# Patient Record
Sex: Female | Born: 1945 | Race: Black or African American | Hispanic: No | Smoking: Current every day smoker
Health system: Southern US, Community
[De-identification: ages and names within clinical notes are randomized; demographics above are authoritative.]

## PROBLEM LIST (undated history)

## (undated) DIAGNOSIS — I10 Essential (primary) hypertension: Secondary | ICD-10-CM

## (undated) DIAGNOSIS — G2581 Restless legs syndrome: Secondary | ICD-10-CM

## (undated) DIAGNOSIS — E785 Hyperlipidemia, unspecified: Secondary | ICD-10-CM

## (undated) DIAGNOSIS — I82402 Acute embolism and thrombosis of unspecified deep veins of left lower extremity: Secondary | ICD-10-CM

## (undated) HISTORY — PX: COLONOSCOPY: SHX174

## (undated) HISTORY — PX: CHOLECYSTECTOMY: SHX55

## (undated) HISTORY — PX: ABDOMINAL HYSTERECTOMY: SHX81

---

## 2003-12-27 ENCOUNTER — Ambulatory Visit: Payer: Self-pay

## 2004-01-10 ENCOUNTER — Ambulatory Visit: Payer: Self-pay

## 2004-11-19 ENCOUNTER — Ambulatory Visit: Payer: Self-pay | Admitting: Internal Medicine

## 2005-12-23 ENCOUNTER — Ambulatory Visit: Payer: Self-pay | Admitting: Family Medicine

## 2006-01-20 ENCOUNTER — Ambulatory Visit: Payer: Self-pay | Admitting: Gastroenterology

## 2006-12-20 ENCOUNTER — Ambulatory Visit: Payer: Self-pay | Admitting: Family Medicine

## 2007-12-26 ENCOUNTER — Ambulatory Visit: Payer: Self-pay | Admitting: Family Medicine

## 2008-06-07 ENCOUNTER — Emergency Department: Payer: Self-pay | Admitting: Emergency Medicine

## 2008-12-26 ENCOUNTER — Ambulatory Visit: Payer: Self-pay | Admitting: Family Medicine

## 2009-12-30 ENCOUNTER — Ambulatory Visit: Payer: Self-pay | Admitting: Family Medicine

## 2010-08-14 ENCOUNTER — Ambulatory Visit: Payer: Self-pay | Admitting: Internal Medicine

## 2010-09-27 ENCOUNTER — Emergency Department: Payer: Self-pay | Admitting: *Deleted

## 2011-01-05 ENCOUNTER — Ambulatory Visit: Payer: Self-pay

## 2011-04-02 ENCOUNTER — Ambulatory Visit: Payer: Self-pay

## 2011-04-02 LAB — CBC WITH DIFFERENTIAL/PLATELET
Basophil #: 0.2 10*3/uL — ABNORMAL HIGH (ref 0.0–0.1)
Eosinophil #: 0.3 10*3/uL (ref 0.0–0.7)
Eosinophil %: 4.9 %
HCT: 42 % (ref 35.0–47.0)
HGB: 14 g/dL (ref 12.0–16.0)
Lymphocyte %: 32.2 %
MCHC: 33.3 g/dL (ref 32.0–36.0)
Neutrophil #: 3.2 10*3/uL (ref 1.4–6.5)
Neutrophil %: 53.2 %
Platelet: 270 10*3/uL (ref 150–440)
RBC: 4.79 10*6/uL (ref 3.80–5.20)
RDW: 13.6 % (ref 11.5–14.5)

## 2011-04-02 LAB — COMPREHENSIVE METABOLIC PANEL
Albumin: 4 g/dL (ref 3.4–5.0)
Alkaline Phosphatase: 101 U/L (ref 50–136)
Bilirubin,Total: 0.4 mg/dL (ref 0.2–1.0)
Calcium, Total: 9.3 mg/dL (ref 8.5–10.1)
Chloride: 103 mmol/L (ref 98–107)
Co2: 32 mmol/L (ref 21–32)
Creatinine: 0.83 mg/dL (ref 0.60–1.30)
EGFR (African American): >60
EGFR (Non-African Amer.): >60
Osmolality: 281 (ref 275–301)
SGOT(AST): 8 U/L — ABNORMAL LOW (ref 15–37)
SGPT (ALT): 21 U/L
Sodium: 142 mmol/L (ref 136–145)

## 2011-04-02 LAB — URINALYSIS, COMPLETE
Bacteria: NEGATIVE
Bilirubin,UR: NEGATIVE
Glucose,UR: NEGATIVE mg/dL (ref 0–75)
Nitrite: NEGATIVE
Specific Gravity: 1.01 (ref 1.003–1.030)

## 2011-04-02 LAB — LIPASE, BLOOD: Lipase: 77 U/L (ref 73–393)

## 2011-04-03 LAB — URINE CULTURE

## 2012-01-13 ENCOUNTER — Ambulatory Visit: Payer: Self-pay

## 2012-02-24 ENCOUNTER — Ambulatory Visit: Payer: Self-pay

## 2013-01-16 ENCOUNTER — Ambulatory Visit: Payer: Self-pay | Admitting: *Deleted

## 2013-06-09 ENCOUNTER — Ambulatory Visit: Payer: Self-pay | Admitting: Family Medicine

## 2013-06-09 LAB — URINALYSIS, COMPLETE
Bilirubin,UR: NEGATIVE
Glucose,UR: NEGATIVE mg/dL (ref 0–75)
KETONE: NEGATIVE
Leukocyte Esterase: NEGATIVE
Nitrite: NEGATIVE
Ph: 6 (ref 4.5–8.0)
SPECIFIC GRAVITY: 1.025 (ref 1.003–1.030)
WBC UR: NONE SEEN /HPF (ref 0–5)

## 2013-06-09 LAB — GC/CHLAMYDIA PROBE AMP

## 2013-06-09 LAB — WET PREP, GENITAL

## 2013-07-03 ENCOUNTER — Ambulatory Visit: Payer: Self-pay | Admitting: Family Medicine

## 2013-12-06 DIAGNOSIS — H409 Unspecified glaucoma: Secondary | ICD-10-CM | POA: Insufficient documentation

## 2014-01-17 ENCOUNTER — Ambulatory Visit: Payer: Self-pay | Admitting: Family Medicine

## 2014-12-18 ENCOUNTER — Encounter: Payer: Self-pay | Admitting: *Deleted

## 2014-12-19 ENCOUNTER — Ambulatory Visit: Payer: Medicare HMO | Admitting: Anesthesiology

## 2014-12-19 ENCOUNTER — Ambulatory Visit
Admission: RE | Admit: 2014-12-19 | Discharge: 2014-12-19 | Disposition: A | Discharge status: Home Health | Payer: Medicare HMO | Source: Ambulatory Visit | Attending: Unknown Physician Specialty | Admitting: Unknown Physician Specialty

## 2014-12-19 ENCOUNTER — Encounter
Admission: RE | Disposition: A | Discharge status: Home Health | Payer: Self-pay | Source: Ambulatory Visit | Attending: Unknown Physician Specialty

## 2014-12-19 ENCOUNTER — Encounter: Payer: Self-pay | Admitting: Anesthesiology

## 2014-12-19 DIAGNOSIS — K5730 Diverticulosis of large intestine without perforation or abscess without bleeding: Secondary | ICD-10-CM | POA: Insufficient documentation

## 2014-12-19 DIAGNOSIS — K621 Rectal polyp: Secondary | ICD-10-CM | POA: Insufficient documentation

## 2014-12-19 DIAGNOSIS — Z9049 Acquired absence of other specified parts of digestive tract: Secondary | ICD-10-CM | POA: Diagnosis not present

## 2014-12-19 DIAGNOSIS — E785 Hyperlipidemia, unspecified: Secondary | ICD-10-CM | POA: Diagnosis not present

## 2014-12-19 DIAGNOSIS — Z79899 Other long term (current) drug therapy: Secondary | ICD-10-CM | POA: Diagnosis not present

## 2014-12-19 DIAGNOSIS — Z90710 Acquired absence of both cervix and uterus: Secondary | ICD-10-CM | POA: Diagnosis not present

## 2014-12-19 DIAGNOSIS — D124 Benign neoplasm of descending colon: Secondary | ICD-10-CM | POA: Diagnosis not present

## 2014-12-19 DIAGNOSIS — D125 Benign neoplasm of sigmoid colon: Secondary | ICD-10-CM | POA: Insufficient documentation

## 2014-12-19 DIAGNOSIS — G2581 Restless legs syndrome: Secondary | ICD-10-CM | POA: Insufficient documentation

## 2014-12-19 DIAGNOSIS — Z86010 Personal history of colonic polyps: Secondary | ICD-10-CM | POA: Insufficient documentation

## 2014-12-19 DIAGNOSIS — K640 First degree hemorrhoids: Secondary | ICD-10-CM | POA: Insufficient documentation

## 2014-12-19 HISTORY — DX: Restless legs syndrome: G25.81

## 2014-12-19 HISTORY — DX: Hyperlipidemia, unspecified: E78.5

## 2014-12-19 HISTORY — PX: COLONOSCOPY WITH PROPOFOL: SHX5780

## 2014-12-19 SURGERY — COLONOSCOPY WITH PROPOFOL
Anesthesia: General

## 2014-12-19 MED ORDER — EPHEDRINE SULFATE 50 MG/ML IJ SOLN
INTRAMUSCULAR | Status: DC | PRN
  Administered 2014-12-19 (×2): 5 mg via INTRAVENOUS

## 2014-12-19 MED ORDER — FENTANYL CITRATE (PF) 100 MCG/2ML IJ SOLN
INTRAMUSCULAR | Status: DC | PRN
  Administered 2014-12-19: 50 ug via INTRAVENOUS

## 2014-12-19 MED ORDER — PROPOFOL 500 MG/50ML IV EMUL
INTRAVENOUS | Status: DC | PRN
  Administered 2014-12-19: 120 ug/kg/min via INTRAVENOUS

## 2014-12-19 MED ORDER — MIDAZOLAM HCL 2 MG/2ML IJ SOLN
INTRAMUSCULAR | Status: DC | PRN
  Administered 2014-12-19: 1 mg via INTRAVENOUS

## 2014-12-19 MED ORDER — SODIUM CHLORIDE 0.9 % IV SOLN: INTRAVENOUS | Status: DC

## 2014-12-19 MED ORDER — SODIUM CHLORIDE 0.9 % IV SOLN
INTRAVENOUS | Status: DC
  Administered 2014-12-19: 1000 mL via INTRAVENOUS

## 2014-12-19 NOTE — Anesthesia Postprocedure Evaluation (Signed)
  Anesthesia Post-op Note  Patient: Rachel Best  Procedure(s) Performed: Procedure(s): COLONOSCOPY WITH PROPOFOL (N/A)  Anesthesia type:General  Patient location: PACU  Post pain: Pain level controlled  Post assessment: Post-op Vital signs reviewed, Patient's Cardiovascular Status Stable, Respiratory Function Stable, Patent Airway and No signs of Nausea or vomiting  Post vital signs: Reviewed and stable  Last Vitals:  Filed Vitals:   12/19/14 1209  BP: 103/57  Pulse: 81  Temp:   Resp: 17    Level of consciousness: awake, alert  and patient cooperative  Complications: No apparent anesthesia complications

## 2014-12-19 NOTE — H&P (Signed)
   Primary Care Physician:  Pcp Not In System Primary Gastroenterologist:  Dr. Vira Agar  Pre-Procedure History & Physical: HPI:  Rachel Best is a 69 y.o. female is here for an colonoscopy.   Past Medical History  Diagnosis Date  . Hyperlipidemia   . Restless legs syndrome (RLS)     Past Surgical History  Procedure Laterality Date  . Cholecystectomy    . Abdominal hysterectomy      Prior to Admission medications   Medication Sig Start Date End Date Taking? Authorizing Provider  Flaxseed, Linseed, (FLAXSEED OIL PO) Take by mouth.   Yes Historical Provider, MD  naproxen (NAPROSYN) 500 MG tablet Take 500 mg by mouth 2 (two) times daily with a meal.   Yes Historical Provider, MD  OMEGA-3 FATTY ACIDS PO Take by mouth.   Yes Historical Provider, MD  pseudoephedrine (SUDAFED) 30 MG tablet Take 30 mg by mouth every 4 (four) hours as needed for congestion.   Yes Historical Provider, MD  simvastatin (ZOCOR) 20 MG tablet Take 20 mg by mouth daily.   Yes Historical Provider, MD    Allergies as of 11/18/2014  . (Not on File)    History reviewed. No pertinent family history.  Social History   Social History  . Marital Status: Married    Spouse Name: N/A  . Number of Children: N/A  . Years of Education: N/A   Occupational History  . Not on file.   Social History Main Topics  . Smoking status: Not on file  . Smokeless tobacco: Not on file  . Alcohol Use: Not on file  . Drug Use: Not on file  . Sexual Activity: Not on file   Other Topics Concern  . Not on file   Social History Narrative    Review of Systems: See HPI, otherwise negative ROS  Physical Exam: BP 124/76 mmHg  Pulse 76  Temp(Src) 97.8 F (36.6 C)  Resp 16  Ht 5\' 8"  (1.727 m)  Wt 82.555 kg (182 lb)  BMI 27.68 kg/m2  SpO2 100% General:   Alert,  pleasant and cooperative in NAD Head:  Normocephalic and atraumatic. Neck:  Supple; no masses or thyromegaly. Lungs:  Clear throughout to auscultation.     Heart:  Regular rate and rhythm. Abdomen:  Soft, nontender and nondistended. Normal bowel sounds, without guarding, and without rebound.   Neurologic:  Alert and  oriented x4;  grossly normal neurologically.  Impression/Plan: Rachel Best is here for an colonoscopy to be performed for [facility] colon polyps  Risks, benefits, limitations, and alternatives regarding  colonoscopy have been reviewed with the patient.  Questions have been answered.  All parties agreeable.   Gaylyn Cheers, MD  12/19/2014, 11:19 AM

## 2014-12-19 NOTE — Anesthesia Procedure Notes (Signed)
Performed by: COOK-MARTIN, Natoria Archibald Pre-anesthesia Checklist: Patient identified, Emergency Drugs available, Suction available, Patient being monitored and Timeout performed Patient Re-evaluated:Patient Re-evaluated prior to inductionOxygen Delivery Method: Nasal cannula Preoxygenation: Pre-oxygenation with 100% oxygen Intubation Type: IV induction Placement Confirmation: positive ETCO2 and CO2 detector       

## 2014-12-19 NOTE — Transfer of Care (Signed)
Immediate Anesthesia Transfer of Care Note  Patient: Rachel Best  Procedure(s) Performed: Procedure(s): COLONOSCOPY WITH PROPOFOL (N/A)  Patient Location: PACU  Anesthesia Type:General  Level of Consciousness: awake and sedated  Airway & Oxygen Therapy: Patient Spontanous Breathing and Patient connected to nasal cannula oxygen  Post-op Assessment: Report given to RN and Post -op Vital signs reviewed and stable  Post vital signs: Reviewed and stable  Last Vitals:  Filed Vitals:   12/19/14 1025  BP: 124/76  Pulse: 76  Temp: 36.6 C  Resp: 16    Complications: No apparent anesthesia complications

## 2014-12-19 NOTE — Op Note (Signed)
[facility] Gastroenterology Patient Name: Rachel Best Procedure Date: 12/19/2014 11:12 AM MRN: [id number] Account #: [id number] Date of Birth: 1945-11-02 Admit Type: Outpatient Age: 69 Room: [facility] ENDO ROOM 1 Gender: Female Note Status: Finalized Procedure:         Colonoscopy Indications:       High risk colon cancer surveillance: Personal history of                     colonic polyps Providers:         Manya Silvas, MD Medicines:         Propofol per Anesthesia Complications:     No immediate complications. Procedure:         Pre-Anesthesia Assessment:                    - After reviewing the risks and benefits, the patient was                     deemed in satisfactory condition to undergo the procedure.                    After obtaining informed consent, the colonoscope was                     passed under direct vision. Throughout the procedure, the                     patient's blood pressure, pulse, and oxygen saturations                     were monitored continuously. The Colonoscope was                     introduced through the anus and advanced to the the cecum,                     identified by appendiceal orifice and ileocecal valve. The                     colonoscopy was performed without difficulty. The patient                     tolerated the procedure well. The quality of the bowel                     preparation was excellent. Findings:      Many sessile polyps were found in the rectum and in the descending       colon. The polyps were small in size. These polyps were removed with a       cold snare. Resection and retrieval were complete.      Two sessile polyps were found in the sigmoid colon. The polyps were       diminutive in size. These polyps were removed with a cold biopsy       forceps. Resection and retrieval were complete.      Internal hemorrhoids were found during endoscopy. The hemorrhoids were       small and Grade I  (internal hemorrhoids that do not prolapse).      A few small-mouthed diverticula were found in the sigmoid colon.      The exam was otherwise without abnormality. Impression:        - Many small polyps in the rectum and in  the descending                     colon. Resected and retrieved.                    - Two diminutive polyps in the sigmoid colon. Resected and                     retrieved.                    - Internal hemorrhoids.                    - Diverticulosis in the sigmoid colon.                    - The examination was otherwise normal. Recommendation:    - Await pathology results. Manya Silvas, MD 12/19/2014 11:58:02 AM This report has been signed electronically. Number of Addenda: 0 Note Initiated On: 12/19/2014 11:12 AM Scope Withdrawal Time: 0 hours 22 minutes 14 seconds  Total Procedure Duration: 0 hours 28 minutes 35 seconds       [facility]

## 2014-12-19 NOTE — Anesthesia Preprocedure Evaluation (Signed)
Anesthesia Evaluation  Patient identified by MRN, date of birth, ID band Patient awake    Reviewed: Allergy & Precautions, NPO status , Patient's Chart, lab work & pertinent test results  Airway Mallampati: II       Dental  (+) Upper Dentures, Lower Dentures   Pulmonary  Sinus allergies   Pulmonary exam normal breath sounds clear to auscultation       Cardiovascular negative cardio ROS Normal cardiovascular exam     Neuro/Psych negative neurological ROS  negative psych ROS   GI/Hepatic negative GI ROS, Neg liver ROS,   Endo/Other  negative endocrine ROS  Renal/GU negative Renal ROS     Musculoskeletal negative musculoskeletal ROS (+)   Abdominal Normal abdominal exam  (+)   Peds  Hematology negative hematology ROS (+)   Anesthesia Other Findings Increased cholesterol  Reproductive/Obstetrics                             Anesthesia Physical Anesthesia Plan  ASA: II  Anesthesia Plan: General   Post-op Pain Management:    Induction: Intravenous  Airway Management Planned: Nasal Cannula  Additional Equipment:   Intra-op Plan:   Post-operative Plan:   Informed Consent: I have reviewed the patients History and Physical, chart, labs and discussed the procedure including the risks, benefits and alternatives for the proposed anesthesia with the patient or authorized representative who has indicated his/her understanding and acceptance.   Dental advisory given  Plan Discussed with: CRNA and Surgeon  Anesthesia Plan Comments:         Anesthesia Quick Evaluation

## 2014-12-20 LAB — SURGICAL PATHOLOGY

## 2014-12-23 ENCOUNTER — Encounter: Payer: Self-pay | Admitting: Unknown Physician Specialty

## 2014-12-30 DIAGNOSIS — Z860100 Personal history of colon polyps, unspecified: Secondary | ICD-10-CM | POA: Insufficient documentation

## 2015-04-15 ENCOUNTER — Other Ambulatory Visit: Payer: Self-pay | Admitting: Family Medicine

## 2015-04-15 DIAGNOSIS — Z1231 Encounter for screening mammogram for malignant neoplasm of breast: Secondary | ICD-10-CM

## 2015-04-16 ENCOUNTER — Ambulatory Visit
Admission: RE | Admit: 2015-04-16 | Discharge: 2015-04-16 | Disposition: A | Discharge status: Home / Self Care | Payer: Medicare Other | Source: Ambulatory Visit | Attending: Family Medicine | Admitting: Family Medicine

## 2015-04-16 DIAGNOSIS — Z1231 Encounter for screening mammogram for malignant neoplasm of breast: Secondary | ICD-10-CM | POA: Diagnosis present

## 2015-07-21 ENCOUNTER — Other Ambulatory Visit: Payer: Self-pay | Admitting: Family Medicine

## 2015-07-21 DIAGNOSIS — E041 Nontoxic single thyroid nodule: Secondary | ICD-10-CM

## 2015-07-28 ENCOUNTER — Ambulatory Visit
Admission: RE | Admit: 2015-07-28 | Discharge: 2015-07-28 | Disposition: A | Discharge status: Home / Self Care | Payer: Medicare Other | Source: Ambulatory Visit | Attending: Family Medicine | Admitting: Family Medicine

## 2015-07-28 DIAGNOSIS — E041 Nontoxic single thyroid nodule: Secondary | ICD-10-CM | POA: Diagnosis not present

## 2015-10-16 DIAGNOSIS — H40003 Preglaucoma, unspecified, bilateral: Secondary | ICD-10-CM | POA: Insufficient documentation

## 2015-10-16 DIAGNOSIS — E7800 Pure hypercholesterolemia, unspecified: Secondary | ICD-10-CM | POA: Insufficient documentation

## 2015-10-16 DIAGNOSIS — H269 Unspecified cataract: Secondary | ICD-10-CM | POA: Insufficient documentation

## 2015-10-16 DIAGNOSIS — I10 Essential (primary) hypertension: Secondary | ICD-10-CM | POA: Insufficient documentation

## 2015-12-09 ENCOUNTER — Ambulatory Visit
Admission: EM | Admit: 2015-12-09 | Discharge: 2015-12-09 | Discharge status: Absent without leave | Payer: Medicare Other

## 2015-12-11 ENCOUNTER — Ambulatory Visit: Payer: Medicare Other

## 2015-12-11 ENCOUNTER — Ambulatory Visit
Admission: EM | Admit: 2015-12-11 | Discharge: 2015-12-11 | Disposition: A | Discharge status: Home / Self Care | Payer: Medicare Other | Attending: Family Medicine | Admitting: Family Medicine

## 2015-12-11 ENCOUNTER — Encounter: Payer: Self-pay | Admitting: *Deleted

## 2015-12-11 DIAGNOSIS — F17200 Nicotine dependence, unspecified, uncomplicated: Secondary | ICD-10-CM | POA: Insufficient documentation

## 2015-12-11 DIAGNOSIS — M62838 Other muscle spasm: Secondary | ICD-10-CM

## 2015-12-11 DIAGNOSIS — M479 Spondylosis, unspecified: Secondary | ICD-10-CM | POA: Insufficient documentation

## 2015-12-11 DIAGNOSIS — Z79899 Other long term (current) drug therapy: Secondary | ICD-10-CM | POA: Insufficient documentation

## 2015-12-11 DIAGNOSIS — M47892 Other spondylosis, cervical region: Secondary | ICD-10-CM

## 2015-12-11 MED ORDER — TIZANIDINE HCL 4 MG PO TABS: 4.0000 mg | ORAL_TABLET | Freq: Three times a day (TID) | ORAL | 0 refills | Status: DC | PRN

## 2015-12-11 MED ORDER — MELOXICAM 15 MG PO TABS: 15.0000 mg | ORAL_TABLET | Freq: Every day | ORAL | 0 refills | Status: DC

## 2015-12-11 NOTE — ED Triage Notes (Signed)
Pt awoke with left sided neck pain approx 1 week ago. Denies injury.

## 2015-12-11 NOTE — ED Provider Notes (Signed)
[facility]    CSN: [id number] Arrival date & time: 12/11/15  1604     History   Chief Complaint Chief Complaint  Patient presents with  . Torticollis    HPI Rachel Best is a 70 y.o. female.   Neck Injury  This is a new problem. The current episode started more than 1 week ago. The problem occurs constantly. The problem has not changed since onset.Pertinent negatives include no chest pain, no abdominal pain, no headaches and no shortness of breath. The symptoms are aggravated by bending and twisting. Nothing relieves the symptoms. Treatments tried: NSAIDS. The treatment provided no relief.    Past Medical History:  Diagnosis Date  . Hyperlipidemia   . Restless legs syndrome (RLS)     There are no active problems to display for this patient.   Past Surgical History:  Procedure Laterality Date  . ABDOMINAL HYSTERECTOMY    . CHOLECYSTECTOMY    . COLONOSCOPY WITH PROPOFOL N/A 12/19/2014   Procedure: COLONOSCOPY WITH PROPOFOL;  Surgeon: Manya Silvas, MD;  Location: [facility] ENDOSCOPY;  Service: Endoscopy;  Laterality: N/A;    OB History    No data available       Home Medications    Prior to Admission medications   Medication Sig Start Date End Date Taking? Authorizing Provider  naproxen (NAPROSYN) 500 MG tablet Take 500 mg by mouth 2 (two) times daily with a meal.   Yes Historical Provider, MD  simvastatin (ZOCOR) 20 MG tablet Take 20 mg by mouth daily.   Yes Historical Provider, MD  Flaxseed, Linseed, (FLAXSEED OIL PO) Take by mouth.    Historical Provider, MD  meloxicam (MOBIC) 15 MG tablet Take 1 tablet (15 mg total) by mouth daily. 12/11/15   Frederich Cha, MD  OMEGA-3 FATTY ACIDS PO Take by mouth.    Historical Provider, MD  pseudoephedrine (SUDAFED) 30 MG tablet Take 30 mg by mouth every 4 (four) hours as needed for congestion.    Historical Provider, MD  tiZANidine (ZANAFLEX) 4 MG tablet Take 1 tablet (4 mg total) by mouth every 8 (eight) hours  as needed for muscle spasms. 12/11/15   Frederich Cha, MD    Family History Family History  Problem Relation Age of Onset  . Breast cancer Mother 53    Social History Social History  Substance Use Topics  . Smoking status: Current Every Day Smoker  . Smokeless tobacco: Never Used  . Alcohol use No     Allergies   Penicillins   Review of Systems Review of Systems  Constitutional: Negative for activity change.  HENT: Negative for sore throat.   Respiratory: Negative for shortness of breath.   Cardiovascular: Negative for chest pain.  Gastrointestinal: Negative for abdominal pain.  Musculoskeletal: Positive for neck pain.  Neurological: Negative for headaches.  All other systems reviewed and are negative.    Physical Exam Triage Vital Signs ED Triage Vitals  Enc Vitals Group     BP 12/11/15 1740 133/68     Pulse Rate 12/11/15 1740 81     Resp 12/11/15 1740 16     Temp 12/11/15 1740 98.9 F (37.2 C)     Temp Source 12/11/15 1740 Oral     SpO2 12/11/15 1740 100 %     Weight 12/11/15 1742 182 lb (82.6 kg)     Height 12/11/15 1742 5\' 9"  (1.753 m)     Head Circumference --      [place] Flow --  Pain Score 12/11/15 1746 10     Pain Loc --      Pain Edu? --      Excl. in [place]? --    No data found.   Updated Vital Signs BP 133/68 (BP Location: Left Arm)   Pulse 81   Temp 98.9 F (37.2 C) (Oral)   Resp 16   Ht 5\' 9"  (1.753 m)   Wt 182 lb (82.6 kg)   SpO2 100%   BMI 26.88 kg/m   Visual Acuity Right Eye Distance:   Left Eye Distance:   Bilateral Distance:    Right Eye Near:   Left Eye Near:    Bilateral Near:     Physical Exam  Constitutional: She appears well-developed and well-nourished.  HENT:  Head: Normocephalic.  Eyes: Pupils are equal, round, and reactive to light.  Neck: Neck supple. Muscular tenderness present. Decreased range of motion present. No thyroid mass and no thyromegaly present.    Patient tender along the left trapezius muscle  up or and lower belly.  Vitals reviewed.    UC Treatments / Results  Labs (all labs ordered are listed, but only abnormal results are displayed) Labs Reviewed - No data to display  EKG  EKG Interpretation None       Radiology Dg Cervical Spine Complete  Result Date: 12/11/2015 CLINICAL DATA:  LEFT-sided neck pain for 1 week. EXAM: CERVICAL SPINE - COMPLETE 4+ VIEW COMPARISON:  None. FINDINGS: Cervical vertebral bodies intact and aligned with maintenance of cervical lordosis. Mild C4-5, moderate C5-6 and C6-7 disc height loss with uncovertebral hypertrophy and facet arthropathy. Multilevel moderate to severe neural foraminal narrowing. Multilevel uncovertebral hypertrophy and facet arthropathy. No destructive bony lesions. Lateral masses in alignment. Included prevertebral and paraspinal soft tissue planes are nonsuspicious. Patient is edentulous. IMPRESSION: Degenerative change of the cervical spine resulting in multilevel moderate to severe neural foraminal narrowing. No acute fracture deformity or malalignment. Electronically Signed   By: Elon Alas M.D.   On: 12/11/2015 19:20    Procedures Procedures (including critical care time)  Medications Ordered in UC Medications - No data to display   Initial Impression / Assessment and Plan / UC Course  I have reviewed the triage vital signs and the nursing notes.  Pertinent labs & imaging results that were available during my care of the patient were reviewed by me and considered in my medical decision making (see chart for details).  Clinical Course   Will send patient for x-ray of the C-spine. If C-spine x-rays negative without place her on Zanaflex 4 mg 1 tablet twice a day Mobic 15 mg daily. Recommend ice and medication sections Biofreeze or BenGay. Also recommend if not better by Monday she seek chiropractic health at the Chiropractor of her choice. I gave her Dr.Vin Oswaldo Milian name who is at [place].   Final  Clinical Impressions(s) / UC Diagnoses   Final diagnoses:  Muscle spasms of neck  Other osteoarthritis of spine, cervical region    New Prescriptions New Prescriptions   MELOXICAM (MOBIC) 15 MG TABLET    Take 1 tablet (15 mg total) by mouth daily.   TIZANIDINE (ZANAFLEX) 4 MG TABLET    Take 1 tablet (4 mg total) by mouth every 8 (eight) hours as needed for muscle spasms.     Note: This dictation was prepared with Dragon dictation along with smaller phrase technology. Any transcriptional errors that result from this process are unintentional.   Frederich Cha, MD 12/11/15 [phone]

## 2016-03-12 ENCOUNTER — Encounter (INDEPENDENT_AMBULATORY_CARE_PROVIDER_SITE_OTHER): Payer: Self-pay | Admitting: Vascular Surgery

## 2016-03-12 ENCOUNTER — Encounter (INDEPENDENT_AMBULATORY_CARE_PROVIDER_SITE_OTHER): Payer: Self-pay

## 2016-03-12 ENCOUNTER — Ambulatory Visit (INDEPENDENT_AMBULATORY_CARE_PROVIDER_SITE_OTHER): Payer: Medicare Other | Admitting: Vascular Surgery

## 2016-03-12 VITALS — BP 139/75 | HR 102 | Resp 18 | Ht 68.0 in | Wt 182.0 lb

## 2016-03-12 DIAGNOSIS — I70213 Atherosclerosis of native arteries of extremities with intermittent claudication, bilateral legs: Secondary | ICD-10-CM

## 2016-03-12 DIAGNOSIS — E785 Hyperlipidemia, unspecified: Secondary | ICD-10-CM

## 2016-03-12 DIAGNOSIS — I70219 Atherosclerosis of native arteries of extremities with intermittent claudication, unspecified extremity: Secondary | ICD-10-CM | POA: Insufficient documentation

## 2016-03-12 DIAGNOSIS — F17200 Nicotine dependence, unspecified, uncomplicated: Secondary | ICD-10-CM | POA: Diagnosis not present

## 2016-03-12 NOTE — Assessment & Plan Note (Signed)

## 2016-03-12 NOTE — Progress Notes (Signed)
Patient ID: Rachel Best, female   DOB: [birth year], 71 y.o.   MRN: [id number]  Chief Complaint  Patient presents with  . New Patient (Initial Visit)    HPI Rachel Best is a 71 y.o. female.  I am asked to see the patient by [facility] for evaluation of claudication  symptoms.  The patient reports Cramping in her legs with activity. This happens intermittently and has been gradually worsening over several years now. She does not have symptoms of ischemic rest pain. She does not have ulceration or infection. She reports no clear inciting event or causative factor that started the symptoms. Her left lower extremity is more severely affected than the right. She also describes pain in her hip and thigh that radiated down her leg. The claudication-type symptoms and is cramping in her calf muscle with activity. Given her history of hyperlipidemia and ongoing tobacco use, peripheral arterial disease was considered as a causative factor.   Past Medical History:  Diagnosis Date  . Hyperlipidemia   . Restless legs syndrome (RLS)     Past Surgical History:  Procedure Laterality Date  . ABDOMINAL HYSTERECTOMY    . CHOLECYSTECTOMY    . COLONOSCOPY WITH PROPOFOL N/A 12/19/2014   Procedure: COLONOSCOPY WITH PROPOFOL;  Surgeon: Manya Silvas, MD;  Location: [facility] ENDOSCOPY;  Service: Endoscopy;  Laterality: N/A;    Family History  Problem Relation Age of Onset  . Breast cancer Mother 56  . Diabetes Mother   . Hypertension Mother   . Varicose Veins Maternal Grandmother   NO bleeding or clotting disorders  Social History Social History  Substance Use Topics  . Smoking status: Current Every Day Smoker  . Smokeless tobacco: Never Used  . Alcohol use No  NO IVDU  Allergies  Allergen Reactions  . Penicillins     Current Outpatient Prescriptions  Medication Sig Dispense Refill  . OMEGA-3 FATTY ACIDS PO Take by mouth.    . pseudoephedrine (SUDAFED) 30 MG tablet Take 30 mg by mouth  every 4 (four) hours as needed for congestion.    . simvastatin (ZOCOR) 20 MG tablet Take 20 mg by mouth daily.    . Flaxseed, Linseed, (FLAXSEED OIL PO) Take by mouth.    . meloxicam (MOBIC) 15 MG tablet Take 1 tablet (15 mg total) by mouth daily. (Patient not taking: Reported on 03/12/2016) 30 tablet 0  . naproxen (NAPROSYN) 500 MG tablet Take 500 mg by mouth 2 (two) times daily with a meal.    . tiZANidine (ZANAFLEX) 4 MG tablet Take 1 tablet (4 mg total) by mouth every 8 (eight) hours as needed for muscle spasms. (Patient not taking: Reported on 03/12/2016) 60 tablet 0   No current facility-administered medications for this visit.       REVIEW OF SYSTEMS (Negative unless checked)  Constitutional: [] Weight loss  [] Fever  [] Chills Cardiac: [] Chest pain   [] Chest pressure   [] Palpitations   [] Shortness of breath when laying flat   [] Shortness of breath at rest   [] Shortness of breath with exertion. Vascular:  [x] Pain in legs with walking   [] Pain in legs at rest   [] Pain in legs when laying flat   [x] Claudication   [] Pain in feet when walking  [] Pain in feet at rest  [] Pain in feet when laying flat   [] History of DVT   [] Phlebitis   [] Swelling in legs   [] Varicose veins   [] Non-healing ulcers Pulmonary:   [] Uses home oxygen   []   Productive cough   [] Hemoptysis   [] Wheeze  [] COPD   [] Asthma Neurologic:  [] Dizziness  [] Blackouts   [] Seizures   [] History of stroke   [] History of TIA  [] Aphasia   [] Temporary blindness   [] Dysphagia   [] Weakness or numbness in arms   [] Weakness or numbness in legs Musculoskeletal:  [] Arthritis   [] Joint swelling   [] Joint pain   [] Low back pain Hematologic:  [] Easy bruising  [] Easy bleeding   [] Hypercoagulable state   [] Anemic  [] Hepatitis Gastrointestinal:  [] Blood in stool   [] Vomiting blood  [] Gastroesophageal reflux/heartburn   [] Abdominal pain Genitourinary:  [] Chronic kidney disease   [] Difficult urination  [] Frequent urination  [] Burning with urination    [] Hematuria Skin:  [] Rashes   [] Ulcers   [] Wounds Psychological:  [] History of anxiety   []  History of major depression.    Physical Exam BP 139/75   Pulse (!) 102   Resp 18   Ht 5\' 8"  (1.727 m)   Wt 182 lb (82.6 kg)   BMI 27.67 kg/m  Gen:  WD/WN, NAD Head: [place]/AT, No temporalis wasting. Prominent temp pulse not noted. Ear/Nose/Throat: Hearing grossly intact, nares w/o erythema or drainage, oropharynx w/o Erythema/Exudate Eyes: Conjunctiva clear, sclera non-icteric  Neck: trachea midline.  No JVD.  Pulmonary:  Good air movement, no use of accessory muscles, respirations not labored Cardiac: RRR, normal S1, S2 Vascular:  Vessel Right Left  Radial Palpable Palpable  Ulnar Palpable Palpable  Brachial Palpable Palpable  Carotid Palpable, without bruit Palpable, without bruit  Aorta Not palpable N/A  Femoral Palpable Palpable  Popliteal Palpable 1+ Palpable  PT 1+ Palpable Palpable  DP Palpable Trace Palpable   Gastrointestinal: soft, non-tender/non-distended. No guarding/reflex. No masses, surgical incisions, or scars. Musculoskeletal: M/S 5/5 throughout.  Extremities without ischemic changes.  No deformity or atrophy.  Neurologic: Sensation grossly intact in extremities.  Symmetrical.  Speech is fluent. Motor exam as listed above. Psychiatric: Judgment intact, Mood & affect appropriate for pt's clinical situation. Dermatologic: No rashes or ulcers noted.  No cellulitis or open wounds. Lymph : No Cervical, Axillary, or Inguinal lymphadenopathy.   Radiology No results found.  Labs No results found for this or any previous visit (from the past 2160 hour(s)).  Assessment/Plan:  Hyperlipidemia lipid control important in reducing the progression of atherosclerotic disease. Continue statin therapy   Tobacco use disorder We had a discussion for approximately 3 minutes regarding the absolute need for smoking cessation due to the deleterious nature of tobacco on the vascular  system. We discussed the tobacco use would diminish patency of any intervention, and likely significantly worsen progressio of disease. We discussed multiple agents for quitting including replacement therapy or medications to reduce cravings such as Chantix. The patient voices their understanding of the importance of smoking cessation.   Atherosclerosis of native arteries of extremity with intermittent claudication (HCC)  Recommend:  The patient has atypical pain symptoms for pure atherosclerotic disease. However, on physical exam there is suspicion for peripheral arterial disease. She also has multiple atherosclerotic risk factors including ongoing tobacco use. Cessation of tobacco has been recommended.  Noninvasive studies including ABI's and arterial ultrasound of the legs will be obtained and the patient will follow up with me to review these studies.  The patient should continue walking and begin a more formal exercise program. The patient should continue her statin therapy and aggressive treatment of the lipid abnormalities.  The patient should begin wearing graduated compression socks 15-20 mmHg strength to control edema.  Leotis Pain 03/12/2016, 4:41 PM   This note was created with Dragon medical transcription system.  Any errors from dictation are unintentional.

## 2016-03-12 NOTE — Patient Instructions (Signed)

## 2016-03-12 NOTE — Assessment & Plan Note (Signed)
Recommend:  The patient has atypical pain symptoms for pure atherosclerotic disease. However, on physical exam there is suspicion for peripheral arterial disease. She also has multiple atherosclerotic risk factors including ongoing tobacco use. Cessation of tobacco has been recommended.  Noninvasive studies including ABI's and arterial ultrasound of the legs will be obtained and the patient will follow up with me to review these studies.  The patient should continue walking and begin a more formal exercise program. The patient should continue her statin therapy and aggressive treatment of the lipid abnormalities.  The patient should begin wearing graduated compression socks 15-20 mmHg strength to control edema.

## 2016-03-12 NOTE — Assessment & Plan Note (Signed)
lipid control important in reducing the progression of atherosclerotic disease. Continue statin therapy  

## 2016-04-14 ENCOUNTER — Other Ambulatory Visit: Payer: Self-pay | Admitting: Family Medicine

## 2016-04-14 DIAGNOSIS — Z780 Asymptomatic menopausal state: Secondary | ICD-10-CM

## 2016-04-21 ENCOUNTER — Ambulatory Visit
Admission: RE | Admit: 2016-04-21 | Discharge: 2016-04-21 | Disposition: A | Discharge status: Home / Self Care | Payer: Medicare Other | Source: Ambulatory Visit | Attending: Family Medicine | Admitting: Family Medicine

## 2016-04-21 DIAGNOSIS — Z780 Asymptomatic menopausal state: Secondary | ICD-10-CM | POA: Insufficient documentation

## 2016-05-12 ENCOUNTER — Ambulatory Visit (INDEPENDENT_AMBULATORY_CARE_PROVIDER_SITE_OTHER): Payer: Medicare Other

## 2016-05-12 ENCOUNTER — Encounter (INDEPENDENT_AMBULATORY_CARE_PROVIDER_SITE_OTHER): Payer: Self-pay | Admitting: Vascular Surgery

## 2016-05-12 ENCOUNTER — Ambulatory Visit (INDEPENDENT_AMBULATORY_CARE_PROVIDER_SITE_OTHER): Payer: Medicare Other | Admitting: Vascular Surgery

## 2016-05-12 VITALS — BP 138/81 | HR 107 | Resp 17 | Wt 180.0 lb

## 2016-05-12 DIAGNOSIS — I70212 Atherosclerosis of native arteries of extremities with intermittent claudication, left leg: Secondary | ICD-10-CM | POA: Diagnosis not present

## 2016-05-12 DIAGNOSIS — I70213 Atherosclerosis of native arteries of extremities with intermittent claudication, bilateral legs: Secondary | ICD-10-CM

## 2016-05-12 DIAGNOSIS — F17200 Nicotine dependence, unspecified, uncomplicated: Secondary | ICD-10-CM | POA: Diagnosis not present

## 2016-05-12 DIAGNOSIS — E785 Hyperlipidemia, unspecified: Secondary | ICD-10-CM | POA: Diagnosis not present

## 2016-05-12 NOTE — Progress Notes (Signed)
Subjective:    Patient ID: Rachel Best, female    DOB: 1945/10/31, 71 y.o.   MRN: [id number] Chief Complaint  Patient presents with  . Follow-up   Patient last seen on 03/12/16. She presents today to review vascular studies. Her left lower extremity discomfort is stable. The patient underwent an ABI which showed Right ABI: 0.58 and Left 1.01 (no previous for comparison). A bilateral lower extremity arterial duplex is notable for LEFT: Triphasic / Right: Triphasic transitioning to biphasic (mid-femoral) then transitioning to monophasic (popliteal). The patient denies any right lower extremity claudication like symptoms, rest pain or ulcers to her feet / toes. She continues to have left lower extremity pain with activity. Mostly in the lateral calf. No rest pain or ulceration to the left lower extremity.    Review of Systems  Constitutional: Negative.   HENT: Negative.   Eyes: Negative.   Respiratory: Negative.   Cardiovascular:       Left lower extremity pain  Gastrointestinal: Negative.   Endocrine: Negative.   Genitourinary: Negative.   Musculoskeletal: Negative.   Skin: Negative.   Allergic/Immunologic: Negative.   Neurological: Negative.   Hematological: Negative.   Psychiatric/Behavioral: Negative.       Objective:   Physical Exam  Constitutional: She is oriented to person, place, and time. She appears well-developed and well-nourished. No distress.  HENT:  Head: Normocephalic and atraumatic.  Eyes: Conjunctivae are normal. Pupils are equal, round, and reactive to light.  Neck: Normal range of motion.  Cardiovascular: Normal rate, regular rhythm, normal heart sounds and intact distal pulses.   Pulses:      Radial pulses are 2+ on the right side, and 2+ on the left side.       Dorsalis pedis pulses are 1+ on the right side, and 2+ on the left side.       Posterior tibial pulses are 1+ on the right side, and 2+ on the left side.  Pulmonary/Chest: Effort normal.    Musculoskeletal: Normal range of motion. She exhibits no edema.  Neurological: She is alert and oriented to person, place, and time.  Skin: Skin is warm and dry. She is not diaphoretic.  Psychiatric: She has a normal mood and affect. Her behavior is normal. Judgment and thought content normal.  Vitals reviewed.  BP 138/81   Pulse (!) 107   Resp 17   Wt 180 lb (81.6 kg)   BMI 27.37 kg/m   Past Medical History:  Diagnosis Date  . Hyperlipidemia   . Restless legs syndrome (RLS)    Social History   Social History  . Marital status: Married    Spouse name: N/A  . Number of children: N/A  . Years of education: N/A   Occupational History  . Not on file.   Social History Main Topics  . Smoking status: Former Research scientist (life sciences)  . Smokeless tobacco: Never Used  . Alcohol use No  . Drug use: No  . Sexual activity: Not on file   Other Topics Concern  . Not on file   Social History Narrative  . No narrative on file   Past Surgical History:  Procedure Laterality Date  . ABDOMINAL HYSTERECTOMY    . CHOLECYSTECTOMY    . COLONOSCOPY WITH PROPOFOL N/A 12/19/2014   Procedure: COLONOSCOPY WITH PROPOFOL;  Surgeon: Manya Silvas, MD;  Location: [facility] ENDOSCOPY;  Service: Endoscopy;  Laterality: N/A;   Family History  Problem Relation Age of Onset  . Breast cancer Mother 63  .  Diabetes Mother   . Hypertension Mother   . Varicose Veins Maternal Grandmother    Allergies  Allergen Reactions  . Penicillins   . Aspirin Nausea And Vomiting      Assessment & Plan:  Patient last seen on 03/12/16. She presents today to review vascular studies. Her left lower extremity discomfort is stable. The patient underwent an ABI which showed Right ABI: 0.58 and Left 1.01 (no previous for comparison). A bilateral lower extremity arterial duplex is notable for LEFT: Triphasic / Right: Triphasic transitioning to biphasic (mid-femoral) then transitioning to monophasic (popliteal). The patient denies any right  lower extremity claudication like symptoms, rest pain or ulcers to her feet / toes. She continues to have left lower extremity pain with activity. Mostly in the lateral calf. No rest pain or ulceration to the left lower extremity.   1. Atherosclerosis of native artery of left lower extremity with intermittent claudication ([place]) - Stable Patient with RLE arterial disease however she is asymptomatic at this time. No intervention at this time as she is asymptomatic.  Will follow closely and see back in six months.  Patient to follow up with PCP and undergo work up for DJD and LLE has normal arterial perfusion.  - VAS [place] ABI WITH/WO TBI; Future - VAS [place] LOWER EXTREMITY ARTERIAL DUPLEX; Future  2. Hyperlipidemia, unspecified hyperlipidemia type - Stable Encouraged good control as its slows the progression of atherosclerotic disease  3. Tobacco use disorder - Stable I have discussed (approximately 5 minutes) with the patient the role of tobacco in the pathogenesis of atherosclerosis and its effect on the progression of the disease, impact on the durability of interventions and its limitations on the formation of collateral pathways. I have recommended absolute tobacco cessation. I have discussed various options available for assistance with tobacco cessation including over the counter methods (Nicotine gum, patch and lozenges). We also discussed prescription options (Chantix, Nicotine Inhaler / Nasal Spray). The patient is not interested in pursuing any prescription tobacco cessation options at this time. The patient voices their understanding.   Current Outpatient Prescriptions on File Prior to Visit  Medication Sig Dispense Refill  . OMEGA-3 FATTY ACIDS PO Take by mouth.    . simvastatin (ZOCOR) 20 MG tablet Take 20 mg by mouth daily.    . Flaxseed, Linseed, (FLAXSEED OIL PO) Take by mouth.    . meloxicam (MOBIC) 15 MG tablet Take 1 tablet (15 mg total) by mouth daily. (Patient not taking: Reported  on 03/12/2016) 30 tablet 0  . naproxen (NAPROSYN) 500 MG tablet Take 500 mg by mouth 2 (two) times daily with a meal.    . pseudoephedrine (SUDAFED) 30 MG tablet Take 30 mg by mouth every 4 (four) hours as needed for congestion.    Marland Kitchen tiZANidine (ZANAFLEX) 4 MG tablet Take 1 tablet (4 mg total) by mouth every 8 (eight) hours as needed for muscle spasms. (Patient not taking: Reported on 03/12/2016) 60 tablet 0   No current facility-administered medications on file prior to visit.     There are no Patient Instructions on file for this visit. No Follow-up on file.   Icelynn Onken A Seham Gardenhire, PA-C

## 2016-10-14 ENCOUNTER — Other Ambulatory Visit: Payer: Self-pay | Admitting: Pediatrics

## 2016-10-14 DIAGNOSIS — R059 Cough, unspecified: Secondary | ICD-10-CM

## 2016-10-14 DIAGNOSIS — R05 Cough: Secondary | ICD-10-CM

## 2016-10-14 DIAGNOSIS — R5383 Other fatigue: Secondary | ICD-10-CM

## 2016-10-14 DIAGNOSIS — Z720 Tobacco use: Secondary | ICD-10-CM

## 2016-10-20 ENCOUNTER — Ambulatory Visit
Admission: RE | Admit: 2016-10-20 | Discharge: 2016-10-20 | Disposition: A | Discharge status: Home / Self Care | Payer: Medicare Other | Source: Ambulatory Visit | Attending: Pediatrics | Admitting: Pediatrics

## 2016-10-20 DIAGNOSIS — I2510 Atherosclerotic heart disease of native coronary artery without angina pectoris: Secondary | ICD-10-CM | POA: Insufficient documentation

## 2016-10-20 DIAGNOSIS — I313 Pericardial effusion (noninflammatory): Secondary | ICD-10-CM | POA: Insufficient documentation

## 2016-10-20 DIAGNOSIS — I700 Atherosclerosis of aorta: Secondary | ICD-10-CM | POA: Insufficient documentation

## 2016-10-20 DIAGNOSIS — R059 Cough, unspecified: Secondary | ICD-10-CM

## 2016-10-20 DIAGNOSIS — Z720 Tobacco use: Secondary | ICD-10-CM | POA: Diagnosis not present

## 2016-10-20 DIAGNOSIS — R5383 Other fatigue: Secondary | ICD-10-CM | POA: Insufficient documentation

## 2016-10-20 DIAGNOSIS — R05 Cough: Secondary | ICD-10-CM | POA: Diagnosis present

## 2016-11-12 ENCOUNTER — Ambulatory Visit (INDEPENDENT_AMBULATORY_CARE_PROVIDER_SITE_OTHER): Payer: Medicare Other | Admitting: Vascular Surgery

## 2016-11-12 ENCOUNTER — Ambulatory Visit (INDEPENDENT_AMBULATORY_CARE_PROVIDER_SITE_OTHER): Payer: Medicare Other

## 2016-11-12 ENCOUNTER — Encounter (INDEPENDENT_AMBULATORY_CARE_PROVIDER_SITE_OTHER): Payer: Self-pay | Admitting: Vascular Surgery

## 2016-11-12 VITALS — BP 124/75 | HR 70 | Resp 18 | Ht 68.0 in | Wt 175.0 lb

## 2016-11-12 DIAGNOSIS — I70212 Atherosclerosis of native arteries of extremities with intermittent claudication, left leg: Secondary | ICD-10-CM | POA: Diagnosis not present

## 2016-11-12 DIAGNOSIS — E785 Hyperlipidemia, unspecified: Secondary | ICD-10-CM | POA: Diagnosis not present

## 2016-11-12 DIAGNOSIS — I70213 Atherosclerosis of native arteries of extremities with intermittent claudication, bilateral legs: Secondary | ICD-10-CM

## 2016-11-12 DIAGNOSIS — F17200 Nicotine dependence, unspecified, uncomplicated: Secondary | ICD-10-CM

## 2016-11-12 NOTE — Progress Notes (Signed)
MRN : [id number]  Rachel Best is a 71 y.o. ([birth year]) female who presents with chief complaint of  Chief Complaint  Patient presents with  . Follow-up    6 months ABI RLE ART DUP---  . Hip Problem    LEFT- hurts-- buckles & gives away x 2 months  .  History of Present Illness: Patient returns today in follow up of PAD. she has been having more pain in her legs. Both lower extremities are affected. The left calf activity and has a lot of cramping and discomfort. The right leg has had stable claudication symptoms and is about the same as it was at her last visit. Her ABIs today are stable at 0.6 on the right and 1.0 on the left, but her duplex demonstrates an unstable appearing plaque in her distal left superficial femoral artery. The velocity elevation at this location was borderline hemodynamically significant, but there appeared to be somewhat of a mobile thrombus or mobile plaque in this area. This was reproduced by 2 vascular technologist. She continues to have velocities consistent with a significant stenosis in the right distal superficial femoral artery as well.  Past Medical History:  Diagnosis Date  . Hyperlipidemia   . Restless legs syndrome (RLS)          Past Surgical History:  Procedure Laterality Date  . ABDOMINAL HYSTERECTOMY    . CHOLECYSTECTOMY    . COLONOSCOPY WITH PROPOFOL N/A 12/19/2014   Procedure: COLONOSCOPY WITH PROPOFOL;  Surgeon: Manya Silvas, MD;  Location: [facility] ENDOSCOPY;  Service: Endoscopy;  Laterality: N/A;         Family History  Problem Relation Age of Onset  . Breast cancer Mother 35  . Diabetes Mother   . Hypertension Mother   . Varicose Veins Maternal Grandmother   NO bleeding or clotting disorders  Social History     Social History  Substance Use Topics  . Smoking status: Current Every Day Smoker  . Smokeless tobacco: Never Used  . Alcohol use No  NO IVDU      Allergies  Allergen Reactions  .  Penicillins           Current Outpatient Prescriptions  Medication Sig Dispense Refill  . OMEGA-3 FATTY ACIDS PO Take by mouth.    . pseudoephedrine (SUDAFED) 30 MG tablet Take 30 mg by mouth every 4 (four) hours as needed for congestion.    . simvastatin (ZOCOR) 20 MG tablet Take 20 mg by mouth daily.    . Flaxseed, Linseed, (FLAXSEED OIL PO) Take by mouth.    . meloxicam (MOBIC) 15 MG tablet Take 1 tablet (15 mg total) by mouth daily. (Patient not taking: Reported on 03/12/2016) 30 tablet 0  . naproxen (NAPROSYN) 500 MG tablet Take 500 mg by mouth 2 (two) times daily with a meal.    . tiZANidine (ZANAFLEX) 4 MG tablet Take 1 tablet (4 mg total) by mouth every 8 (eight) hours as needed for muscle spasms. (Patient not taking: Reported on 03/12/2016) 60 tablet 0   No current facility-administered medications for this visit.       REVIEW OF SYSTEMS (Negative unless checked)  Constitutional: [] Weight loss  [] Fever  [] Chills Cardiac: [] Chest pain   [] Chest pressure   [] Palpitations   [] Shortness of breath when laying flat   [] Shortness of breath at rest   [] Shortness of breath with exertion. Vascular:  [x] Pain in legs with walking   [] Pain in legs at rest   [] Pain  in legs when laying flat   [x] Claudication   [] Pain in feet when walking  [] Pain in feet at rest  [] Pain in feet when laying flat   [] History of DVT   [] Phlebitis   [] Swelling in legs   [] Varicose veins   [] Non-healing ulcers Pulmonary:   [] Uses home oxygen   [] Productive cough   [] Hemoptysis   [] Wheeze  [] COPD   [] Asthma Neurologic:  [] Dizziness  [] Blackouts   [] Seizures   [] History of stroke   [] History of TIA  [] Aphasia   [] Temporary blindness   [] Dysphagia   [] Weakness or numbness in arms   [] Weakness or numbness in legs Musculoskeletal:  [x] Arthritis   [] Joint swelling   [] Joint pain   [] Low back pain Hematologic:  [] Easy bruising  [] Easy bleeding   [] Hypercoagulable state   [] Anemic   [] Hepatitis Gastrointestinal:  [] Blood in stool   [] Vomiting blood  [] Gastroesophageal reflux/heartburn   [] Abdominal pain Genitourinary:  [] Chronic kidney disease   [] Difficult urination  [] Frequent urination  [] Burning with urination   [] Hematuria Skin:  [] Rashes   [] Ulcers   [] Wounds Psychological:  [] History of anxiety   []  History of major depression.   Physical Examination  BP 124/75   Pulse 70   Resp 18   Ht 5\' 8"  (1.727 m)   Wt 79.4 kg (175 lb)   SpO2 99%   BMI 26.61 kg/m  Gen:  WD/WN, NAD Head: [place]/AT, No temporalis wasting. Ear/Nose/Throat: Hearing grossly intact, nares w/o erythema or drainage, trachea midline Eyes: Conjunctiva clear. Sclera non-icteric Neck: Supple.  No JVD.  Pulmonary:  Good air movement, no use of accessory muscles.  Cardiac: RRR, normal S1, S2 Vascular:  Vessel Right Left  Radial Palpable Palpable                  Femoral Palpable Palpable  Popliteal Not Palpable 1+ Palpable  PT 1+ Palpable 1+ Palpable  DP Trace Palpable Palpable   Gastrointestinal: soft, non-tender/non-distended. No guarding/reflex.  Musculoskeletal: M/S 5/5 throughout.  No deformity or atrophy. No LE edema. Neurologic: Sensation grossly intact in extremities.  Symmetrical.  Speech is fluent.  Psychiatric: Judgment intact, Mood & affect appropriate for pt's clinical situation. Dermatologic: No rashes or ulcers noted.  No cellulitis or open wounds.       Labs No results found for this or any previous visit (from the past 2160 hour(s)).  Radiology Ct Chest Wo Contrast  Result Date: 10/20/2016 CLINICAL DATA:  History of tobacco use. EXAM: CT CHEST WITHOUT CONTRAST TECHNIQUE: Multidetector CT imaging of the chest was performed following the standard protocol without IV contrast. COMPARISON:  Radiographs of February 24, 2012. FINDINGS: Cardiovascular: Atherosclerosis of thoracic aorta is noted without aneurysm formation. Normal cardiac size. Mild pericardial effusion is  noted. Coronary artery calcifications are noted. Mediastinum/Nodes: No enlarged mediastinal or axillary lymph nodes. Thyroid gland, trachea, and esophagus demonstrate no significant findings. Lungs/Pleura: Lungs are clear. No pleural effusion or pneumothorax. Upper Abdomen: Multiple hepatic cysts are noted. Musculoskeletal: No chest wall mass or suspicious bone lesions identified. IMPRESSION: Mild pericardial effusion. Coronary artery calcifications are noted suggesting coronary disease. Aortic Atherosclerosis (ICD10-I70.0). Electronically Signed   By: Marijo Conception, M.D.   On: 10/20/2016 11:58     Assessment/Plan Hyperlipidemia lipid control important in reducing the progression of atherosclerotic disease. Continue statin therapy   Tobacco use disorder We had a discussion for approximately 3 minutes regarding the absolute need for smoking cessation due to the deleterious nature of tobacco on the  vascular system. She says she really does not smoke much except when she is in the game rooms. At that point, she is surrounded by smoke. We discussed the tobacco use would diminish patency of any intervention, and likely significantly worsen progressio of disease. We discussed multiple agents for quitting including replacement therapy or medications to reduce cravings such as Chantix. The patient voices their understanding of the importance of smoking cessation.  Atherosclerosis of native arteries of extremity with intermittent claudication (HCC) Her ABIs today are stable at 0.6 on the right and 1.0 on the left, but her duplex demonstrates an unstable appearing plaque in her distal left superficial femoral artery. The velocity elevation at this location was borderline hemodynamically significant, but there appeared to be somewhat of a mobile thrombus or mobile plaque in this area. This was reproduced by 2 vascular technologist. She continues to have velocities consistent with a significant stenosis in the  right distal superficial femoral artery as well. we had a long discussion today about treatment options. She needs to abstain from tobacco and stay away from smoking as much as possible although I understand this is difficult. The unstable plaque in her left distal SFA is certainly concerning because of this has distal embolization she would likely have an acute ischemic situation. Although her ABIs preserved at rest, she does describe claudication symptoms there as well. Given these findings, I would recommend a left lower extremity angiogram with possible intervention for this unstable plaque and also hoping this will help her claudication symptoms. She should continue her simvastatin. She says she has intolerance to aspirin, but we will need another anticoagulant with and after the procedure. She voices her understanding and is agreeable to proceed.    Leotis Pain, MD  11/12/2016 3:55 PM    This note was created with Dragon medical transcription system.  Any errors from dictation are purely unintentional

## 2016-11-12 NOTE — Patient Instructions (Signed)
Angiogram  An angiogram, also called angiography, is a procedure used to look at the blood vessels. In this procedure, dye is injected through a long, thin tube (catheter) into an artery. X-rays are then taken. The X-rays will show if there is a blockage or problem in a blood vessel.  Tell a health care provider about:   Any allergies you have, including allergies to shellfish or contrast dye.   All medicines you are taking, including vitamins, herbs, eye drops, creams, and over-the-counter medicines.   Any problems you or family members have had with anesthetic medicines.   Any blood disorders you have.   Any surgeries you have had.   Any previous kidney problems or failure you have had.   Any medical conditions you have.   Possibility of pregnancy, if this applies.  What are the risks?  Generally, an angiogram is a safe procedure. However, as with any procedure, problems can occur. Possible problems include:   Injury to the blood vessels, including rupture or bleeding.   Infection or bruising at the catheter site.   Allergic reaction to the dye or contrast used.   Kidney damage from the dye or contrast used.   Blood clots that can lead to a stroke or heart attack.    What happens before the procedure?   Do not eat or drink after midnight on the night before the procedure, or as directed by your health care provider.   Ask your health care provider if you may drink enough water to take any needed medicines the morning of the procedure.  What happens during the procedure?   You may be given a medicine to help you relax (sedative) before and during the procedure. This medicine is given through an IV access tube that is inserted into one of your veins.   The area where the catheter will be inserted will be washed and shaved. This is usually done in the groin but may be done in the fold of your arm (near your elbow) or in the wrist.   A medicine will be given to numb the area where the catheter will  be inserted (local anesthetic).   The catheter will be inserted with a guide wire into an artery. The catheter is guided by using a type of X-ray (fluoroscopy) to the blood vessel being examined.   Dye is then injected into the catheter, and X-rays are taken. The dye helps to show where any narrowing or blockages are located.  What happens after the procedure?   If the procedure is done through the leg, you will be kept in bed lying flat for several hours. You will be instructed to not bend or cross your legs.   The insertion site will be checked frequently.   The pulse in your feet or wrist will be checked frequently.   Additional blood tests, X-rays, and electrocardiography may be done.   You may need to stay in the hospital overnight for observation.  This information is not intended to replace advice given to you by your health care provider. Make sure you discuss any questions you have with your health care provider.  Document Released: 11/04/2004 Document Revised: 07/09/2015 Document Reviewed: 06/28/2012  Elsevier Interactive Patient Education  2017 [employer]

## 2016-11-12 NOTE — Assessment & Plan Note (Signed)
Her ABIs today are stable at 0.6 on the right and 1.0 on the left, but her duplex demonstrates an unstable appearing plaque in her distal left superficial femoral artery. The velocity elevation at this location was borderline hemodynamically significant, but there appeared to be somewhat of a mobile thrombus or mobile plaque in this area. This was reproduced by 2 vascular technologist. She continues to have velocities consistent with a significant stenosis in the right distal superficial femoral artery as well. we had a long discussion today about treatment options. She needs to abstain from tobacco and stay away from smoking as much as possible although I understand this is difficult. The unstable plaque in her left distal SFA is certainly concerning because of this has distal embolization she would likely have an acute ischemic situation. Although her ABIs preserved at rest, she does describe claudication symptoms there as well. Given these findings, I would recommend a left lower extremity angiogram with possible intervention for this unstable plaque and also hoping this will help her claudication symptoms. She should continue her simvastatin. She says she has intolerance to aspirin, but we will need another anticoagulant with and after the procedure. She voices her understanding and is agreeable to proceed.

## 2016-11-16 ENCOUNTER — Encounter (INDEPENDENT_AMBULATORY_CARE_PROVIDER_SITE_OTHER): Payer: Self-pay

## 2016-11-19 ENCOUNTER — Other Ambulatory Visit (INDEPENDENT_AMBULATORY_CARE_PROVIDER_SITE_OTHER): Payer: Self-pay | Admitting: Vascular Surgery

## 2016-11-24 ENCOUNTER — Inpatient Hospital Stay: Admission: RE | Admit: 2016-11-24 | Payer: Medicare Other | Source: Ambulatory Visit

## 2016-11-24 ENCOUNTER — Encounter (INDEPENDENT_AMBULATORY_CARE_PROVIDER_SITE_OTHER): Payer: Self-pay

## 2016-11-29 ENCOUNTER — Encounter
Admission: RE | Disposition: A | Discharge status: Home / Self Care | Payer: Self-pay | Source: Ambulatory Visit | Attending: Vascular Surgery

## 2016-11-29 ENCOUNTER — Ambulatory Visit
Admission: RE | Admit: 2016-11-29 | Discharge: 2016-11-29 | Disposition: A | Discharge status: Home / Self Care | Payer: Medicare Other | Source: Ambulatory Visit | Attending: Vascular Surgery | Admitting: Vascular Surgery

## 2016-11-29 DIAGNOSIS — E785 Hyperlipidemia, unspecified: Secondary | ICD-10-CM | POA: Diagnosis not present

## 2016-11-29 DIAGNOSIS — I743 Embolism and thrombosis of arteries of the lower extremities: Secondary | ICD-10-CM | POA: Diagnosis not present

## 2016-11-29 DIAGNOSIS — Z90710 Acquired absence of both cervix and uterus: Secondary | ICD-10-CM | POA: Diagnosis not present

## 2016-11-29 DIAGNOSIS — Z9049 Acquired absence of other specified parts of digestive tract: Secondary | ICD-10-CM | POA: Diagnosis not present

## 2016-11-29 DIAGNOSIS — I70212 Atherosclerosis of native arteries of extremities with intermittent claudication, left leg: Secondary | ICD-10-CM

## 2016-11-29 DIAGNOSIS — Z9889 Other specified postprocedural states: Secondary | ICD-10-CM | POA: Insufficient documentation

## 2016-11-29 DIAGNOSIS — F17200 Nicotine dependence, unspecified, uncomplicated: Secondary | ICD-10-CM | POA: Insufficient documentation

## 2016-11-29 DIAGNOSIS — Z833 Family history of diabetes mellitus: Secondary | ICD-10-CM | POA: Diagnosis not present

## 2016-11-29 DIAGNOSIS — G2581 Restless legs syndrome: Secondary | ICD-10-CM | POA: Diagnosis not present

## 2016-11-29 DIAGNOSIS — Z880 Allergy status to penicillin: Secondary | ICD-10-CM | POA: Insufficient documentation

## 2016-11-29 DIAGNOSIS — Z8249 Family history of ischemic heart disease and other diseases of the circulatory system: Secondary | ICD-10-CM | POA: Diagnosis not present

## 2016-11-29 DIAGNOSIS — I70213 Atherosclerosis of native arteries of extremities with intermittent claudication, bilateral legs: Secondary | ICD-10-CM | POA: Insufficient documentation

## 2016-11-29 DIAGNOSIS — Z803 Family history of malignant neoplasm of breast: Secondary | ICD-10-CM | POA: Diagnosis not present

## 2016-11-29 HISTORY — PX: LOWER EXTREMITY ANGIOGRAPHY: CATH118251

## 2016-11-29 LAB — CREATININE, SERUM
Creatinine, Ser: 0.84 mg/dL (ref 0.44–1.00)
GFR calc Af Amer: >60 mL/min (ref >60)
GFR calc non Af Amer: >60 mL/min (ref >60)

## 2016-11-29 LAB — BUN: BUN: 13 mg/dL (ref 6–20)

## 2016-11-29 SURGERY — LOWER EXTREMITY ANGIOGRAPHY
Anesthesia: Moderate Sedation | Laterality: Left

## 2016-11-29 MED ORDER — FENTANYL CITRATE (PF) 100 MCG/2ML IJ SOLN
INTRAMUSCULAR | Status: AC
  Filled 2016-11-29: qty 2

## 2016-11-29 MED ORDER — FAMOTIDINE 20 MG PO TABS: 40.0000 mg | ORAL_TABLET | ORAL | Status: DC | PRN

## 2016-11-29 MED ORDER — CLINDAMYCIN PHOSPHATE 300 MG/50ML IV SOLN
INTRAVENOUS | Status: AC
  Filled 2016-11-29: qty 50

## 2016-11-29 MED ORDER — LIDOCAINE-EPINEPHRINE (PF) 1 %-1:200000 IJ SOLN
INTRAMUSCULAR | Status: AC
  Filled 2016-11-29: qty 30

## 2016-11-29 MED ORDER — SODIUM CHLORIDE 0.9% FLUSH: 3.0000 mL | INTRAVENOUS | Status: DC | PRN

## 2016-11-29 MED ORDER — HEPARIN SODIUM (PORCINE) 1000 UNIT/ML IJ SOLN
INTRAMUSCULAR | Status: AC
  Filled 2016-11-29: qty 1

## 2016-11-29 MED ORDER — HYDRALAZINE HCL 20 MG/ML IJ SOLN: 5.0000 mg | INTRAMUSCULAR | Status: DC | PRN

## 2016-11-29 MED ORDER — METHYLPREDNISOLONE SODIUM SUCC 125 MG IJ SOLR: 125.0000 mg | INTRAMUSCULAR | Status: DC | PRN

## 2016-11-29 MED ORDER — SODIUM CHLORIDE 0.9 % IV SOLN: INTRAVENOUS | Status: DC

## 2016-11-29 MED ORDER — LABETALOL HCL 5 MG/ML IV SOLN: 10.0000 mg | INTRAVENOUS | Status: DC | PRN

## 2016-11-29 MED ORDER — SODIUM CHLORIDE 0.9% FLUSH: 3.0000 mL | Freq: Two times a day (BID) | INTRAVENOUS | Status: DC

## 2016-11-29 MED ORDER — HEPARIN (PORCINE) IN NACL 2-0.9 UNIT/ML-% IJ SOLN
INTRAMUSCULAR | Status: AC
  Filled 2016-11-29: qty 1000

## 2016-11-29 MED ORDER — FENTANYL CITRATE (PF) 100 MCG/2ML IJ SOLN
INTRAMUSCULAR | Status: DC | PRN
  Administered 2016-11-29: 25 ug via INTRAVENOUS
  Administered 2016-11-29: 50 ug via INTRAVENOUS

## 2016-11-29 MED ORDER — IOPAMIDOL (ISOVUE-300) INJECTION 61%
INTRAVENOUS | Status: DC | PRN
  Administered 2016-11-29: 40 mL via INTRAVENOUS

## 2016-11-29 MED ORDER — MIDAZOLAM HCL 2 MG/2ML IJ SOLN
INTRAMUSCULAR | Status: DC | PRN
  Administered 2016-11-29: 1 mg via INTRAVENOUS
  Administered 2016-11-29: 2 mg via INTRAVENOUS

## 2016-11-29 MED ORDER — CLOPIDOGREL BISULFATE 75 MG PO TABS: 75.0000 mg | ORAL_TABLET | Freq: Every day | ORAL | Status: DC

## 2016-11-29 MED ORDER — SODIUM CHLORIDE 0.9 % IV SOLN: 250.0000 mL | INTRAVENOUS | Status: DC | PRN

## 2016-11-29 MED ORDER — SODIUM CHLORIDE 0.9 % IV SOLN
INTRAVENOUS | Status: DC
Start: 2016-11-29 — End: 2016-11-29
  Administered 2016-11-29: 1000 mL via INTRAVENOUS

## 2016-11-29 MED ORDER — CLINDAMYCIN PHOSPHATE 300 MG/50ML IV SOLN: 300.0000 mg | Freq: Once | INTRAVENOUS | Status: DC

## 2016-11-29 MED ORDER — HEPARIN SODIUM (PORCINE) 1000 UNIT/ML IJ SOLN
INTRAMUSCULAR | Status: DC | PRN
  Administered 2016-11-29: 5000 [IU] via INTRAVENOUS

## 2016-11-29 MED ORDER — CLOPIDOGREL BISULFATE 75 MG PO TABS: 75.0000 mg | ORAL_TABLET | Freq: Every day | ORAL | 11 refills | Status: DC

## 2016-11-29 MED ORDER — MIDAZOLAM HCL 5 MG/5ML IJ SOLN
INTRAMUSCULAR | Status: AC
  Filled 2016-11-29: qty 5

## 2016-11-29 SURGICAL SUPPLY — 16 items
BALLN LUTONIX 5X150X130 (BALLOONS) ×3
BALLOON LUTONIX 5X150X130 (BALLOONS) ×1 IMPLANT
CATH BEACON 5 .035 65 KMP TIP (CATHETERS) ×6 IMPLANT
CATH PIG 70CM (CATHETERS) ×3 IMPLANT
COVER PROBE U/S 5X48 (MISCELLANEOUS) ×3 IMPLANT
DEVICE PRESTO INFLATION (MISCELLANEOUS) ×3 IMPLANT
DEVICE STARCLOSE SE CLOSURE (Vascular Products) ×3 IMPLANT
PACK ANGIOGRAPHY (CUSTOM PROCEDURE TRAY) ×3 IMPLANT
SHEATH ANL2 6FRX45 HC (SHEATH) ×3 IMPLANT
SHEATH BRITE TIP 5FRX11 (SHEATH) ×3 IMPLANT
STENT VIABAHN 6X150X120 (Permanent Stent) ×3 IMPLANT
TOWEL OR 17X26 4PK STRL BLUE (TOWEL DISPOSABLE) ×3 IMPLANT
TUBING CONTRAST HIGH PRESS 72 (TUBING) ×3 IMPLANT
WIRE G V18X300CM (WIRE) ×3 IMPLANT
WIRE J 3MM .035X145CM (WIRE) ×3 IMPLANT
WIRE MAGIC TOR.035 180C (WIRE) ×3 IMPLANT

## 2016-11-29 NOTE — H&P (Signed)
[facility] History & Physical Update  The patient was interviewed and re-examined.  The patient's previous History and Physical has been reviewed and is unchanged.  There is no change in the plan of care. We plan to proceed with the scheduled procedure.  Leotis Pain, MD  11/29/2016, 8:12 AM

## 2016-11-29 NOTE — Op Note (Signed)
[facility] Percutaneous Study/Intervention Procedural Note   Date of Surgery: 11/29/2016  Surgeon(s):Antwaun Buth   Assistants:none  Pre-operative Diagnosis: PAD with claudication bilateral lower extremities with unstable appearing plaque and thrombus in left superficial femoral artery on recent duplex  Post-operative diagnosis: Same  Procedure(s) Performed: 1. Ultrasound guidance for vascular access right femoral artery 2. Catheter placement into left popliteal artery from right femoral approach 3. Aortogram and selective left lower extremity angiogram 4. Primary covered stent placement to the left superficial femoral artery with 6 mm diameter by 15 cm length Viabahn stent postdilated with a 5 mm diameter by 15 cm length Lutonix drug-coated balloon 5. StarClose closure device right femoral artery  EBL: 5 cc  Contrast: 40 cc  Fluoro Time: 2 minutes  Moderate Conscious Sedation Time: approximately 30 minutes using 3 mg of Versed and 75 Mcg of Fentanyl  Indications: Patient is a 71 y.o.female with a known history of peripheral arterial disease. The patient has noninvasive study showing an unstable plaque in her left superficial femoral artery with partial thrombosis. The patient is brought in for angiography for further evaluation and potential treatment. Risks and benefits are discussed and informed consent is obtained  Procedure: The patient was identified and appropriate procedural time out was performed. The patient was then placed supine on the table and prepped and draped in the usual sterile fashion.Moderate conscious sedation was administered during a face to face encounter with the patient throughout the procedure with my supervision of the RN administering medicines and monitoring the patient's vital signs, pulse oximetry, telemetry and mental status throughout from the  start of the procedure until the patient was taken to the recovery room. Ultrasound was used to evaluate the right common femoral artery. It was patent . A digital ultrasound image was acquired. A Seldinger needle was used to access the right common femoral artery under direct ultrasound guidance and a permanent image was performed. A 0.035 J wire was advanced without resistance and a 5Fr sheath was placed. Pigtail catheter was placed into the aorta and an AP aortogram was performed. This demonstrated normal renal arteries and normal aorta and iliac segments without significant stenosis. I then crossed the aortic bifurcation and advanced to the left femoral head. Selective left lower extremity angiogram was then performed. This demonstrated a normal common femoral artery and profunda femoris artery. In the proximal to mid SFA there was an area roughly 50% stenosis with atypical plaque configuration.  In the mid to distal SFA there was an area with greater than 60% stenosis with a clear unstable lesion that did appear to have mobile thrombus.  The popliteal artery normalized and then there appeared to be good runoff in the tibial vessels without focal stenosis. The patient was systemically heparinized and a 6 [place] Ansell sheath was then placed over the [employer] wire. I then used a Kumpe catheter and the V 18 wire to easily cross the lesions in the superficial femoral artery and confirm intraluminal flow.  I then elected to primarily stent to cover this area with a 6 mm diameter by 15 cm length Viabahn stent in the mid and distal superficial femoral artery.  This was postdilated with a 5 mm diameter by 15 cm length Lutonix drug-coated angioplasty balloon inflated to 12 atm for 1 minute.  Completion angiogram showed less than 10% residual stenosis with no thrombus readily apparent and no signs of distal embolization. I elected to terminate the procedure. The sheath was removed and StarClose  closure  device was deployed in the right femoral artery with excellent hemostatic result. The patient was taken to the recovery room in stable condition having tolerated the procedure well.  Findings:  Aortogram: This demonstrated normal renal arteries and normal aorta and iliac segments without significant stenosis. Left lower Extremity:In the proximal to mid SFA there was an area roughly 50% stenosis with atypical plaque configuration.  In the mid to distal SFA there was an area with greater than 60% stenosis with a clear unstable lesion that did appear to have mobile thrombus.  The popliteal artery normalized and then there appeared to be good runoff in the tibial vessels without focal stenosis.   Disposition: Patient was taken to the recovery room in stable condition having tolerated the procedure well.  Complications: None  Rachel Best 11/29/2016 10:17 AM   This note was created with Dragon Medical transcription system. Any errors in dictation are purely unintentional.

## 2016-11-30 ENCOUNTER — Encounter: Payer: Self-pay | Admitting: Vascular Surgery

## 2016-12-04 ENCOUNTER — Emergency Department
Admission: EM | Admit: 2016-12-04 | Discharge: 2016-12-04 | Disposition: A | Discharge status: Home / Self Care | Payer: Medicare Other | Attending: Emergency Medicine | Admitting: Emergency Medicine

## 2016-12-04 ENCOUNTER — Encounter: Payer: Self-pay | Admitting: Emergency Medicine

## 2016-12-04 ENCOUNTER — Emergency Department: Payer: Medicare Other

## 2016-12-04 DIAGNOSIS — I70212 Atherosclerosis of native arteries of extremities with intermittent claudication, left leg: Secondary | ICD-10-CM | POA: Diagnosis not present

## 2016-12-04 DIAGNOSIS — Y92512 Supermarket, store or market as the place of occurrence of the external cause: Secondary | ICD-10-CM | POA: Diagnosis not present

## 2016-12-04 DIAGNOSIS — Z87891 Personal history of nicotine dependence: Secondary | ICD-10-CM | POA: Diagnosis not present

## 2016-12-04 DIAGNOSIS — S99912A Unspecified injury of left ankle, initial encounter: Secondary | ICD-10-CM | POA: Diagnosis present

## 2016-12-04 DIAGNOSIS — Y999 Unspecified external cause status: Secondary | ICD-10-CM | POA: Diagnosis not present

## 2016-12-04 DIAGNOSIS — Z7901 Long term (current) use of anticoagulants: Secondary | ICD-10-CM | POA: Diagnosis not present

## 2016-12-04 DIAGNOSIS — Y939 Activity, unspecified: Secondary | ICD-10-CM | POA: Insufficient documentation

## 2016-12-04 DIAGNOSIS — S9002XA Contusion of left ankle, initial encounter: Secondary | ICD-10-CM | POA: Diagnosis not present

## 2016-12-04 DIAGNOSIS — W208XXA Other cause of strike by thrown, projected or falling object, initial encounter: Secondary | ICD-10-CM | POA: Diagnosis not present

## 2016-12-04 DIAGNOSIS — M25472 Effusion, left ankle: Secondary | ICD-10-CM

## 2016-12-04 DIAGNOSIS — Z79899 Other long term (current) drug therapy: Secondary | ICD-10-CM | POA: Insufficient documentation

## 2016-12-04 NOTE — ED Provider Notes (Signed)
[facility] Emergency Department Provider Note   ____________________________________________   I have reviewed the triage vital signs and the nursing notes.   HISTORY  Chief Complaint Leg Swelling    HPI Rachel Best is a 71 y.o. female's emergency department with left medial ankle pain after dropping a salmon can on her foot at the grocery store. Patient noted pain and swelling that increased throughout the day.  Patient recently had a femoropopliteal stent placement 5-6 days ago due to PAD history.  Patient felt she needed to have the left ankle evaluated due to ongoing pain and swelling. Patient denies fever, chills, headache, vision changes, chest pain, chest tightness, shortness of breath, abdominal pain, nausea and vomiting.  Past Medical History:  Diagnosis Date  . Hyperlipidemia   . Restless legs syndrome (RLS)     Patient Active Problem List   Diagnosis Date Noted  . Hyperlipidemia 03/12/2016  . Tobacco use disorder 03/12/2016  . Atherosclerosis of native arteries of extremity with intermittent claudication ([place]) 03/12/2016    Past Surgical History:  Procedure Laterality Date  . ABDOMINAL HYSTERECTOMY    . CHOLECYSTECTOMY    . COLONOSCOPY WITH PROPOFOL N/A 12/19/2014   Procedure: COLONOSCOPY WITH PROPOFOL;  Surgeon: Manya Silvas, MD;  Location: [facility] ENDOSCOPY;  Service: Endoscopy;  Laterality: N/A;  . LOWER EXTREMITY ANGIOGRAPHY Left 11/29/2016   Procedure: Lower Extremity Angiography;  Surgeon: Algernon Huxley, MD;  Location: [place] [facility];  Service: Cardiovascular;  Laterality: Left;    Prior to Admission medications   Medication Sig Start Date End Date Taking? Authorizing Provider  acetaminophen (TYLENOL) 650 MG CR tablet Take 1,300 mg by mouth every 8 (eight) hours as needed for pain.    [provider]  Calcium Carb-Cholecalciferol (CALCIUM 600+D3 PO) Take 1 tablet by mouth once a week.    [provider]  clopidogrel (PLAVIX) 75 MG tablet Take 1 tablet (75 mg total) by mouth daily. 11/29/16   Algernon Huxley, MD  Omega-3 Fatty Acids (FISH OIL) 1000 MG CAPS Take 2,000 mg by mouth 2 (two) times a week.    [provider]  simvastatin (ZOCOR) 40 MG tablet Take 40 mg by mouth at bedtime. 11/08/16   [provider]    Allergies Penicillins and Aspirin  Family History  Problem Relation Age of Onset  . Breast cancer Mother 3  . Diabetes Mother   . Hypertension Mother   . Varicose Veins Maternal Grandmother     Social History Social History  Substance Use Topics  . Smoking status: Former Smoker    Quit date: 10/30/2016  . Smokeless tobacco: Never Used  . Alcohol use No    Review of Systems Constitutional: Negative for fever/chills Eyes: No visual changes. Cardiovascular: Denies chest pain. Respiratory: Denies shortness of breath. Gastrointestinal: No abdominal pain.   Musculoskeletal: Positive for ankle pain and swelling.  Skin: Negative for rash. Neurological: Negative for headaches. Able to ambulate. ____________________________________________   PHYSICAL EXAM:  VITAL SIGNS: ED Triage Vitals  Enc Vitals Group     BP 12/04/16 2011 (!) 150/68     Pulse Rate 12/04/16 2011 91     Resp 12/04/16 2011 16     Temp 12/04/16 2011 99.4 F (37.4 C)     Temp Source 12/04/16 2011 Oral     SpO2 12/04/16 2011 94 %     Weight 12/04/16 2012 175 lb (79.4 kg)     Height 12/04/16 2012 5'  8" (1.727 m)     Head Circumference --      Peak Flow --      Pain Score 12/04/16 2011 0     Pain Loc --      Pain Edu? --      Excl. in [place]? --     Constitutional: Alert and oriented. Well appearing and in no acute distress.  Eyes: Conjunctivae are normal. PERRL. EOMI  Head: Normocephalic and atraumatic. Cardiovascular: Normal rate, regular rhythm. Respiratory: Normal respiratory effort without tachypnea or retractions.  Musculoskeletal: Left ankle and foot range of motion intact  without limitation or deformity.  Mild visible swelling noted over the medial & lateral malleoli.  Mild tenderness to palpation along the medial malleoli.  Intact sensation throughout the entire left foot and ankle. Neurologic: Normal speech and language.  Skin:  Skin is warm, dry and intact. No rash noted. Psychiatric: Mood and affect are normal. Speech and behavior are normal. Patient exhibits appropriate insight and judgement.  ____________________________________________   LABS (all labs ordered are listed, but only abnormal results are displayed)  Labs Reviewed - No data to display ____________________________________________  EKG none ____________________________________________  RADIOLOGY DG ankle complete FINDINGS: No acute fracture or malalignment. The talar dome is intact. The ankle mortise is symmetric. Plantar and Achilles enthesophytes. Dorsal midfoot spurring. No tibiotalar joint effusion. Soft tissues are unremarkable.  IMPRESSION: No acute osseous abnormality. ____________________________________________   PROCEDURES  Procedure(s) performed: no    Critical Care performed: no ____________________________________________   INITIAL IMPRESSION / ASSESSMENT AND PLAN / ED COURSE  Pertinent labs & imaging results that were available during my care of the patient were reviewed by me and considered in my medical decision making (see chart for details).  Patient presents to emergency department with left ankle pain and mild swelling after dropping a salmon can on her foot at the grocery store earlier today.  History, physical exam findings and imaging are consistent with mild contusion along the left ankle.  She advised to continue monitoring the injury area and to follow-up with her vascular provider if symptoms persist.  Also advised to return to the emergency department if symptoms significantly worsened.  Reassessment of patient prior to discharge was  reassuring.  Patient informed of clinical course, understand medical decision-making process, and agree with plan.  ____________________________________________   FINAL CLINICAL IMPRESSION(S) / ED DIAGNOSES  Final diagnoses:  Contusion of left ankle, initial encounter  Left ankle swelling       NEW MEDICATIONS STARTED DURING THIS VISIT:  Discharge Medication List as of 12/04/2016  9:01 PM       Note:  This document was prepared using Dragon voice recognition software and may include unintentional dictation errors.    Jerolyn Shin, PA-C 12/04/16 2146    Schuyler Amor, MD 12/04/16 [phone]

## 2016-12-04 NOTE — ED Notes (Signed)
Emergency provider at bedside

## 2016-12-04 NOTE — Discharge Instructions (Signed)
If symptoms do not resolve call [facility] Monday.   If symptoms worsen return to the emergency department.   Continue taking all prescribed medications.

## 2016-12-04 NOTE — ED Notes (Signed)
Radiology at bedside

## 2016-12-04 NOTE — ED Triage Notes (Addendum)
Pt ambulatory to triage in NAD, report swelling to left ankle x 1 day.  Pt reports earlier today can fell and hit medial part of ankle but now having pain to  Lateral aspect.  Denies twisting ankle.  Of note, pt had left lower extremity angiogram w/ stent placement on Monday.

## 2016-12-28 ENCOUNTER — Other Ambulatory Visit (INDEPENDENT_AMBULATORY_CARE_PROVIDER_SITE_OTHER): Payer: Medicare Other

## 2016-12-28 ENCOUNTER — Ambulatory Visit (INDEPENDENT_AMBULATORY_CARE_PROVIDER_SITE_OTHER): Payer: Medicare Other | Admitting: Vascular Surgery

## 2016-12-28 ENCOUNTER — Other Ambulatory Visit (INDEPENDENT_AMBULATORY_CARE_PROVIDER_SITE_OTHER): Payer: Self-pay | Admitting: Vascular Surgery

## 2016-12-28 DIAGNOSIS — Z95820 Peripheral vascular angioplasty status with implants and grafts: Secondary | ICD-10-CM

## 2016-12-28 DIAGNOSIS — I70213 Atherosclerosis of native arteries of extremities with intermittent claudication, bilateral legs: Secondary | ICD-10-CM

## 2016-12-29 ENCOUNTER — Telehealth (INDEPENDENT_AMBULATORY_CARE_PROVIDER_SITE_OTHER): Payer: Self-pay | Admitting: Vascular Surgery

## 2016-12-29 NOTE — Telephone Encounter (Signed)
Called the patient and left a message asking the patient to please call me back in regard to reviewing her ABI results from yesterday.

## 2017-04-19 ENCOUNTER — Other Ambulatory Visit: Payer: Self-pay | Admitting: Family Medicine

## 2017-04-19 DIAGNOSIS — Z1231 Encounter for screening mammogram for malignant neoplasm of breast: Secondary | ICD-10-CM

## 2017-04-27 ENCOUNTER — Ambulatory Visit
Admission: RE | Admit: 2017-04-27 | Discharge: 2017-04-27 | Disposition: A | Discharge status: Home / Self Care | Payer: Medicare HMO | Source: Ambulatory Visit | Attending: Family Medicine | Admitting: Family Medicine

## 2017-04-27 DIAGNOSIS — Z1231 Encounter for screening mammogram for malignant neoplasm of breast: Secondary | ICD-10-CM

## 2017-06-29 ENCOUNTER — Other Ambulatory Visit (INDEPENDENT_AMBULATORY_CARE_PROVIDER_SITE_OTHER): Payer: Self-pay | Admitting: Vascular Surgery

## 2017-06-29 DIAGNOSIS — I739 Peripheral vascular disease, unspecified: Secondary | ICD-10-CM

## 2017-07-05 ENCOUNTER — Ambulatory Visit (INDEPENDENT_AMBULATORY_CARE_PROVIDER_SITE_OTHER): Payer: Medicare HMO

## 2017-07-05 ENCOUNTER — Ambulatory Visit (INDEPENDENT_AMBULATORY_CARE_PROVIDER_SITE_OTHER): Payer: Medicare HMO | Admitting: Vascular Surgery

## 2017-07-05 ENCOUNTER — Encounter (INDEPENDENT_AMBULATORY_CARE_PROVIDER_SITE_OTHER): Payer: Self-pay | Admitting: Vascular Surgery

## 2017-07-05 VITALS — BP 122/75 | HR 78 | Resp 16 | Ht 68.0 in | Wt 170.0 lb

## 2017-07-05 DIAGNOSIS — F17200 Nicotine dependence, unspecified, uncomplicated: Secondary | ICD-10-CM | POA: Diagnosis not present

## 2017-07-05 DIAGNOSIS — E785 Hyperlipidemia, unspecified: Secondary | ICD-10-CM | POA: Diagnosis not present

## 2017-07-05 DIAGNOSIS — I739 Peripheral vascular disease, unspecified: Secondary | ICD-10-CM

## 2017-07-05 DIAGNOSIS — I70213 Atherosclerosis of native arteries of extremities with intermittent claudication, bilateral legs: Secondary | ICD-10-CM

## 2017-07-05 NOTE — Progress Notes (Signed)
Subjective:    Patient ID: Rachel Best, female    DOB: 11/02/1945, 72 y.o.   MRN: [id number] Chief Complaint  Patient presents with  . Follow-up    20mth abi and right le arterial   Patient presents for a six-month peripheral artery disease follow-up.  Patient has a past medical history of peripheral artery disease requiring endovascular intervention with a left SFA stent on November 29, 2016.  The patient presents today with a complaint.  The patient denies any claudication-like symptoms, rest pain or ulcer formation to the bilateral lower extremity.  The patient underwent a bilateral ABI which was notable for right 0.66 and left 1.03 previous ABI 6 months ago was right 0.67 and left 1.05.  Right lower extremity arterial duplex is notable for triphasic wave forms transitioning to monophasic at the proximal SFA distally.  Left lower extremity arterial duplex triphasic blood flow transitioning to monophasic at the tibial trunk with a triphasic peroneal.  The patient denies any fever, nausea vomiting.  Review of Systems  Constitutional: Negative.   HENT: Negative.   Eyes: Negative.   Respiratory: Negative.   Cardiovascular: Negative.   Gastrointestinal: Negative.   Endocrine: Negative.   Genitourinary: Negative.   Musculoskeletal: Negative.   Skin: Negative.   Allergic/Immunologic: Negative.   Neurological: Negative.   Hematological: Negative.   Psychiatric/Behavioral: Negative.       Objective:   Physical Exam  Constitutional: She is oriented to person, place, and time. She appears well-developed and well-nourished. No distress.  HENT:  Head: Normocephalic and atraumatic.  Right Ear: External ear normal.  Left Ear: External ear normal.  Eyes: Pupils are equal, round, and reactive to light. Conjunctivae and EOM are normal.  Neck: Normal range of motion.  Cardiovascular: Normal rate, regular rhythm, normal heart sounds and intact distal pulses.  Pulses:      Radial pulses are  2+ on the right side, and 2+ on the left side.  To palpate pedal pulses however the bilateral feet are warm  Pulmonary/Chest: Effort normal and breath sounds normal.  Musculoskeletal: Normal range of motion. She exhibits no edema.  Neurological: She is alert and oriented to person, place, and time.  Skin: Skin is warm and dry. She is not diaphoretic.  Psychiatric: She has a normal mood and affect. Her behavior is normal. Judgment and thought content normal.  Vitals reviewed.  BP 122/75 (BP Location: Right Arm)   Pulse 78   Resp 16   Ht 5\' 8"  (1.727 m)   Wt 170 lb (77.1 kg)   BMI 25.85 kg/m   Past Medical History:  Diagnosis Date  . Hyperlipidemia   . Restless legs syndrome (RLS)    Social History   Socioeconomic History  . Marital status: Married    Spouse name: Not on file  . Number of children: Not on file  . Years of education: Not on file  . Highest education level: Not on file  Occupational History  . Not on file  Social Needs  . Financial resource strain: Not on file  . Food insecurity:    Worry: Not on file    Inability: Not on file  . Transportation needs:    Medical: Not on file    Non-medical: Not on file  Tobacco Use  . Smoking status: Former Smoker    Last attempt to quit: 10/30/2016    Years since quitting: 0.6  . Smokeless tobacco: Never Used  Substance and Sexual Activity  . Alcohol  use: No  . Drug use: No  . Sexual activity: Not on file  Lifestyle  . Physical activity:    Days per week: Not on file    Minutes per session: Not on file  . Stress: Not on file  Relationships  . Social connections:    Talks on phone: Not on file    Gets together: Not on file    Attends religious service: Not on file    Active member of club or organization: Not on file    Attends meetings of clubs or organizations: Not on file    Relationship status: Not on file  . Intimate partner violence:    Fear of current or ex partner: Not on file    Emotionally abused:  Not on file    Physically abused: Not on file    Forced sexual activity: Not on file  Other Topics Concern  . Not on file  Social History Narrative  . Not on file   Past Surgical History:  Procedure Laterality Date  . ABDOMINAL HYSTERECTOMY    . CHOLECYSTECTOMY    . COLONOSCOPY WITH PROPOFOL N/A 12/19/2014   Procedure: COLONOSCOPY WITH PROPOFOL;  Surgeon: Manya Silvas, MD;  Location: [facility] ENDOSCOPY;  Service: Endoscopy;  Laterality: N/A;  . LOWER EXTREMITY ANGIOGRAPHY Left 11/29/2016   Procedure: Lower Extremity Angiography;  Surgeon: Algernon Huxley, MD;  Location: [place] [facility];  Service: Cardiovascular;  Laterality: Left;   Family History  Problem Relation Age of Onset  . Diabetes Mother   . Hypertension Mother   . Breast cancer Mother 40  . Varicose Veins Maternal Grandmother   . Breast cancer Maternal Aunt    Allergies  Allergen Reactions  . Penicillins     Has patient had a PCN reaction causing immediate rash, facial/tongue/throat swelling, SOB or lightheadedness with hypotension: Yes Has patient had a PCN reaction causing severe rash involving mucus membranes or skin necrosis: No Has patient had a PCN reaction that required hospitalization: No Has patient had a PCN reaction occurring within the last 10 years: No If all of the above answers are "NO", then may proceed with Cephalosporin use.   . Aspirin Nausea And Vomiting      Assessment & Plan:  Patient presents for a six-month peripheral artery disease follow-up.  Patient has a past medical history of peripheral artery disease requiring endovascular intervention with a left SFA stent on November 29, 2016.  The patient presents today with a complaint.  The patient denies any claudication-like symptoms, rest pain or ulcer formation to the bilateral lower extremity.  The patient underwent a bilateral ABI which was notable for right 0.66 and left 1.03 previous ABI 6 months ago was right 0.67 and left 1.05.  Right  lower extremity arterial duplex is notable for triphasic wave forms transitioning to monophasic at the proximal SFA distally.  Left lower extremity arterial duplex triphasic blood flow transitioning to monophasic at the tibial trunk with a triphasic peroneal.  The patient denies any fever, nausea vomiting.  1. Atherosclerosis of native artery of both lower extremities with intermittent claudication ([place]) - Stable Patient presents today without complaint Patient is asymptomatic and denies any claudication-like symptoms, rest pain or ulceration to the bilateral lower extremity Patient with stable ABI and bilateral lower extremity arterial duplex To follow-up in 6 months for close surveillance of her peripheral artery disease I have discussed with the patient at length the risk factors for and pathogenesis of atherosclerotic disease and  encouraged a healthy diet, regular exercise regimen and blood pressure / glucose control.  The patient was encouraged to call the office in the interim if he experiences any claudication like symptoms, rest pain or ulcers to his feet / toes.  - VAS [place] ABI WITH/WO TBI; Future - VAS [place] LOWER EXTREMITY ARTERIAL DUPLEX; Future  2. Hyperlipidemia, unspecified hyperlipidemia type - Stable Encouraged good control as its slows the progression of atherosclerotic disease  3. Tobacco use disorder - Stable We had a discussion for approximately 10 minutes regarding the absolute need for smoking cessation due to the deleterious nature of tobacco on the vascular system. We discussed the tobacco use would diminish patency of any intervention, and likely significantly worsen progressio of disease. We discussed multiple agents for quitting including replacement therapy or medications to reduce cravings such as Chantix. The patient voices their understanding of the importance of smoking cessation.  Current Outpatient Medications on File Prior to Visit  Medication Sig Dispense Refill    . acetaminophen (TYLENOL) 650 MG CR tablet Take 1,300 mg by mouth every 8 (eight) hours as needed for pain.    . Calcium Carb-Cholecalciferol (CALCIUM 600+D3 PO) Take 1 tablet by mouth once a week.    . clopidogrel (PLAVIX) 75 MG tablet Take 1 tablet (75 mg total) by mouth daily. 30 tablet 11  . Omega-3 Fatty Acids (FISH OIL) 1000 MG CAPS Take 2,000 mg by mouth 2 (two) times a week.    . simvastatin (ZOCOR) 40 MG tablet Take 40 mg by mouth at bedtime.     No current facility-administered medications on file prior to visit.    There are no Patient Instructions on file for this visit. No follow-ups on file.  Anmol Paschen A Ixel Boehning, PA-C

## 2017-10-21 ENCOUNTER — Other Ambulatory Visit: Payer: Self-pay | Admitting: Family Medicine

## 2017-10-21 DIAGNOSIS — I10 Essential (primary) hypertension: Secondary | ICD-10-CM

## 2017-10-21 DIAGNOSIS — R109 Unspecified abdominal pain: Secondary | ICD-10-CM

## 2017-10-25 ENCOUNTER — Ambulatory Visit
Admission: RE | Admit: 2017-10-25 | Discharge: 2017-10-25 | Disposition: A | Discharge status: Home / Self Care | Payer: Medicare HMO | Source: Ambulatory Visit | Attending: Family Medicine | Admitting: Family Medicine

## 2017-10-25 DIAGNOSIS — N281 Cyst of kidney, acquired: Secondary | ICD-10-CM | POA: Insufficient documentation

## 2017-10-25 DIAGNOSIS — I10 Essential (primary) hypertension: Secondary | ICD-10-CM | POA: Diagnosis present

## 2017-10-25 DIAGNOSIS — R109 Unspecified abdominal pain: Secondary | ICD-10-CM

## 2017-12-01 ENCOUNTER — Other Ambulatory Visit (INDEPENDENT_AMBULATORY_CARE_PROVIDER_SITE_OTHER): Payer: Self-pay | Admitting: Vascular Surgery

## 2018-01-10 ENCOUNTER — Ambulatory Visit (INDEPENDENT_AMBULATORY_CARE_PROVIDER_SITE_OTHER): Payer: Medicare HMO

## 2018-01-10 ENCOUNTER — Ambulatory Visit (INDEPENDENT_AMBULATORY_CARE_PROVIDER_SITE_OTHER): Payer: Medicare HMO | Admitting: Vascular Surgery

## 2018-01-10 ENCOUNTER — Encounter (INDEPENDENT_AMBULATORY_CARE_PROVIDER_SITE_OTHER): Payer: Self-pay

## 2018-01-10 ENCOUNTER — Encounter (INDEPENDENT_AMBULATORY_CARE_PROVIDER_SITE_OTHER): Payer: Self-pay | Admitting: Vascular Surgery

## 2018-01-10 DIAGNOSIS — I70213 Atherosclerosis of native arteries of extremities with intermittent claudication, bilateral legs: Secondary | ICD-10-CM

## 2018-01-10 NOTE — Progress Notes (Signed)
Patient left before being seen.

## 2018-01-11 ENCOUNTER — Encounter (INDEPENDENT_AMBULATORY_CARE_PROVIDER_SITE_OTHER): Payer: Self-pay | Admitting: Vascular Surgery

## 2018-01-11 ENCOUNTER — Other Ambulatory Visit (INDEPENDENT_AMBULATORY_CARE_PROVIDER_SITE_OTHER): Payer: Self-pay | Admitting: Vascular Surgery

## 2018-01-12 ENCOUNTER — Other Ambulatory Visit (INDEPENDENT_AMBULATORY_CARE_PROVIDER_SITE_OTHER): Payer: Self-pay | Admitting: Vascular Surgery

## 2018-01-12 DIAGNOSIS — Z95820 Peripheral vascular angioplasty status with implants and grafts: Secondary | ICD-10-CM

## 2018-01-12 DIAGNOSIS — I739 Peripheral vascular disease, unspecified: Secondary | ICD-10-CM

## 2018-03-17 ENCOUNTER — Other Ambulatory Visit: Payer: Self-pay | Admitting: Family Medicine

## 2018-03-17 DIAGNOSIS — Z1231 Encounter for screening mammogram for malignant neoplasm of breast: Secondary | ICD-10-CM

## 2018-04-20 DIAGNOSIS — J4489 Other specified chronic obstructive pulmonary disease: Secondary | ICD-10-CM | POA: Insufficient documentation

## 2018-04-20 DIAGNOSIS — J449 Chronic obstructive pulmonary disease, unspecified: Secondary | ICD-10-CM | POA: Insufficient documentation

## 2018-05-01 ENCOUNTER — Ambulatory Visit: Payer: Medicare HMO

## 2018-05-15 IMAGING — DX DG ANKLE COMPLETE 3+V*L*
3 series · 3 of 3 positions shown · non-contrast
Comparison: None.

CLINICAL DATA: Left ankle pain and swelling after dropping a can on
it.

EXAM:
LEFT ANKLE COMPLETE - 3+ VIEW

[ankle ap]
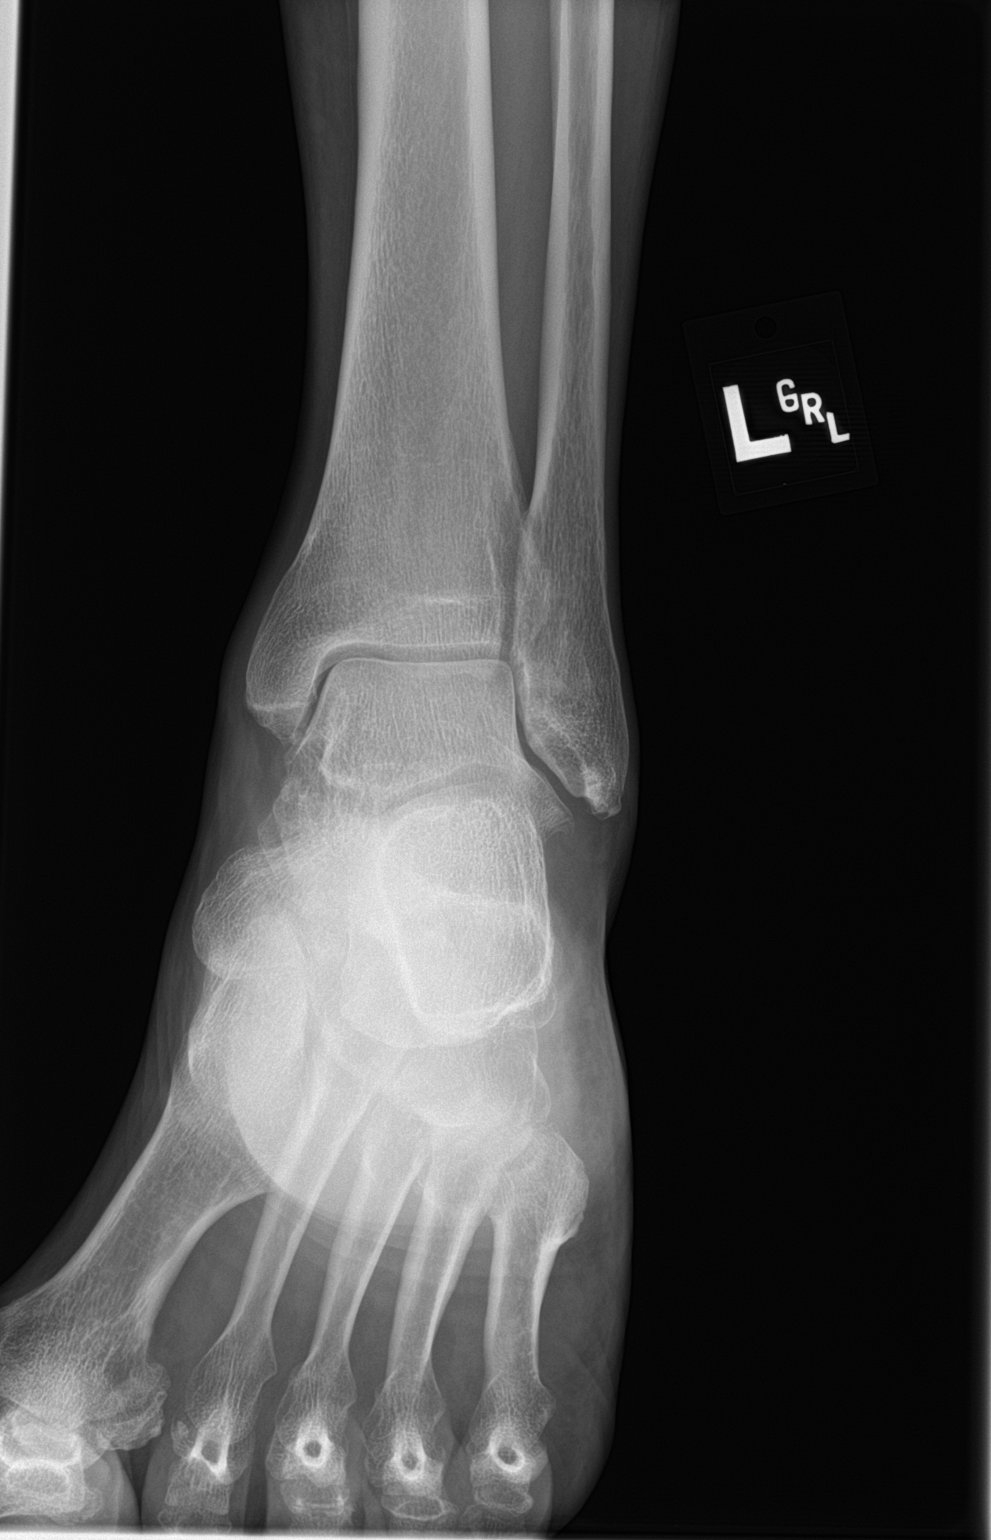

[ankle obl]
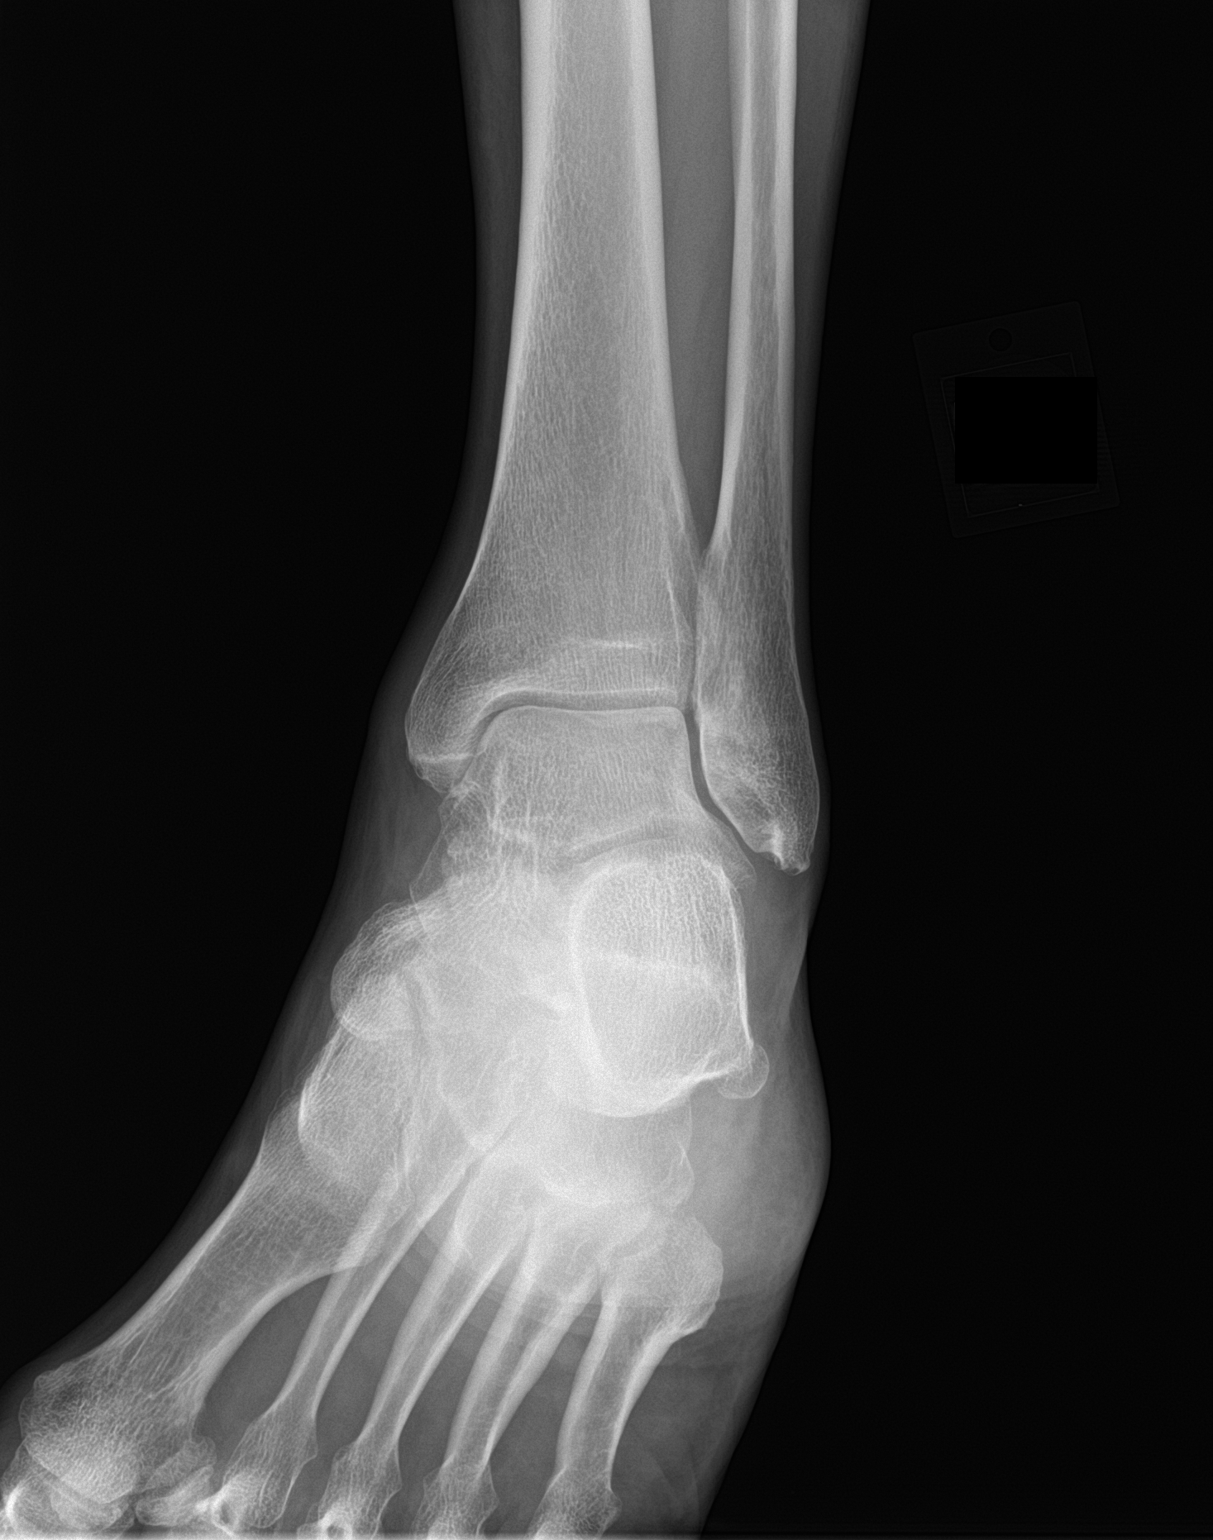

[ankle lat]
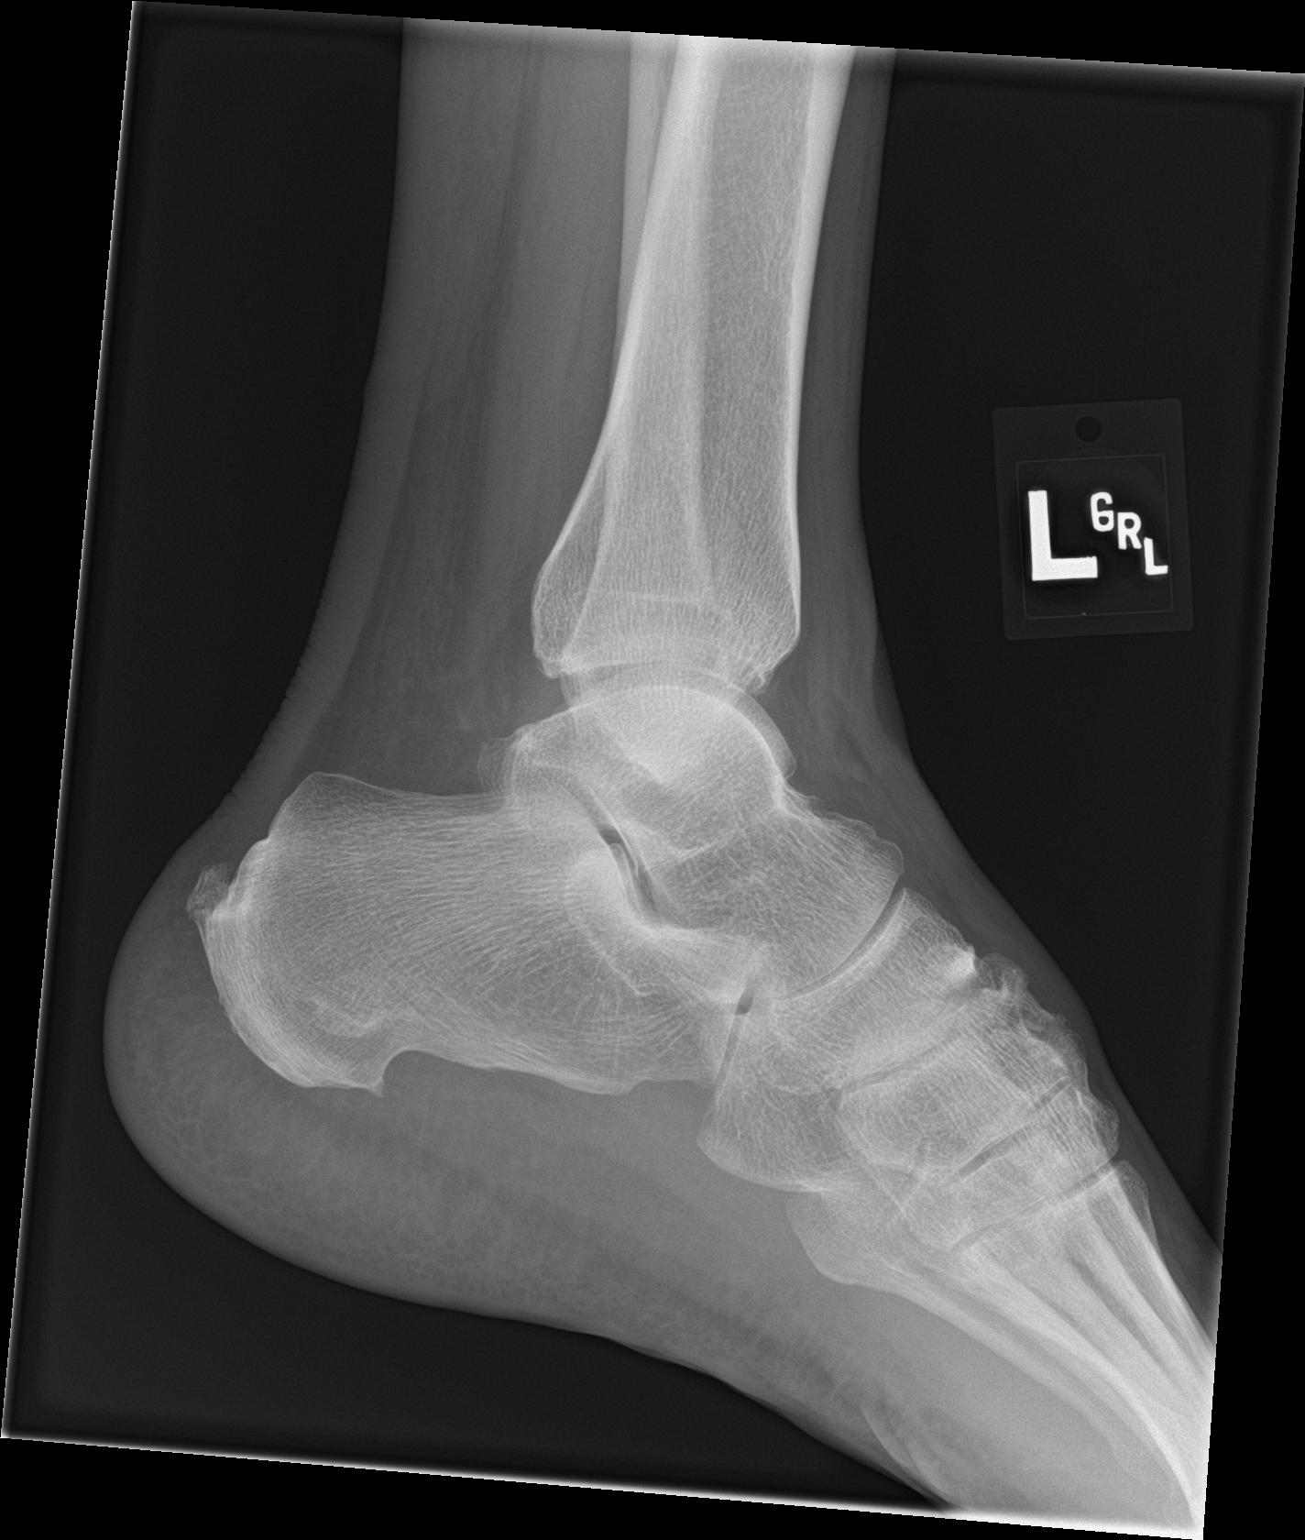

[3 of 3 positions shown; findings below may reference images not displayed]

FINDINGS: No acute fracture or malalignment. The talar dome is intact. The
ankle mortise is symmetric. Plantar and Achilles enthesophytes.
Dorsal midfoot spurring. No tibiotalar joint effusion. Soft tissues
are unremarkable.
IMPRESSION: No acute osseous abnormality.

## 2018-06-08 ENCOUNTER — Ambulatory Visit: Payer: Medicare HMO

## 2018-07-12 ENCOUNTER — Ambulatory Visit: Payer: Medicare HMO

## 2018-08-22 ENCOUNTER — Other Ambulatory Visit: Payer: Self-pay

## 2018-08-22 ENCOUNTER — Ambulatory Visit
Admission: RE | Admit: 2018-08-22 | Discharge: 2018-08-22 | Disposition: A | Discharge status: Home / Self Care | Payer: Medicare HMO | Source: Ambulatory Visit | Attending: Family Medicine | Admitting: Family Medicine

## 2018-08-22 DIAGNOSIS — Z1231 Encounter for screening mammogram for malignant neoplasm of breast: Secondary | ICD-10-CM | POA: Insufficient documentation

## 2018-09-25 ENCOUNTER — Telehealth (INDEPENDENT_AMBULATORY_CARE_PROVIDER_SITE_OTHER): Payer: Self-pay | Admitting: Vascular Surgery

## 2018-09-25 NOTE — Telephone Encounter (Signed)
The patient was last seen on 01/10/18, however didn't stay to have her results reviewed. Her two previous ABI's before (six months apart were stable)  were right 0.66 and left 1.03, previous ABI: right 0.67 and left 1.05. her discomfort sounds more neurological in nature but a non-emergent appointment with either [facility] or Arna Medici can be made with an ABI.

## 2018-09-25 NOTE — Telephone Encounter (Signed)
Can you please contact patient and schedule as advised per Maudie Mercury below. AS, CMA

## 2018-09-26 ENCOUNTER — Encounter (INDEPENDENT_AMBULATORY_CARE_PROVIDER_SITE_OTHER): Payer: Self-pay | Admitting: Nurse Practitioner

## 2018-09-26 ENCOUNTER — Other Ambulatory Visit: Payer: Self-pay

## 2018-09-26 ENCOUNTER — Encounter (INDEPENDENT_AMBULATORY_CARE_PROVIDER_SITE_OTHER): Payer: Self-pay

## 2018-09-26 ENCOUNTER — Ambulatory Visit (INDEPENDENT_AMBULATORY_CARE_PROVIDER_SITE_OTHER): Payer: Medicare HMO | Admitting: Nurse Practitioner

## 2018-09-26 ENCOUNTER — Ambulatory Visit (INDEPENDENT_AMBULATORY_CARE_PROVIDER_SITE_OTHER): Payer: Medicare HMO

## 2018-09-26 VITALS — BP 138/79 | HR 96 | Resp 16 | Wt 172.0 lb

## 2018-09-26 DIAGNOSIS — I70213 Atherosclerosis of native arteries of extremities with intermittent claudication, bilateral legs: Secondary | ICD-10-CM | POA: Diagnosis not present

## 2018-09-26 DIAGNOSIS — I739 Peripheral vascular disease, unspecified: Secondary | ICD-10-CM

## 2018-09-26 DIAGNOSIS — E785 Hyperlipidemia, unspecified: Secondary | ICD-10-CM

## 2018-09-26 DIAGNOSIS — Z95820 Peripheral vascular angioplasty status with implants and grafts: Secondary | ICD-10-CM

## 2018-09-26 DIAGNOSIS — F17200 Nicotine dependence, unspecified, uncomplicated: Secondary | ICD-10-CM

## 2018-10-01 ENCOUNTER — Encounter (INDEPENDENT_AMBULATORY_CARE_PROVIDER_SITE_OTHER): Payer: Self-pay | Admitting: Nurse Practitioner

## 2018-10-01 NOTE — Progress Notes (Signed)
SUBJECTIVE:  Patient ID: Rachel Best, female    DOB: 04-Jan-1946, 73 y.o.   MRN: [id number] Chief Complaint  Patient presents with  . Follow-up    ultrasound follow up    HPI  Rachel Best is a 73 y.o. female that presents today with pain in her left lower extremity.  Patient has a previous history of a left SFA stent placement.  Patient states that her pain goes from her groin down to the side of her leg.  She states that this pain is worse if she sits for a long time and she goes to stand up.  She has had this pain for about a year.  She states that elevation and heating pad actually help the pain.  She states that the pain is not variable with ambulation rather is all the time.  The patient also describes his pain as a throbbing and aching as well.  She has significant history of lower back pain.  She denies any fever, chills, nausea, vomiting or diarrhea.  Today the patient has a right ABI 0.66 with a left ABI 0.83.  Previous studies done on 01/10/2018 had an ABI of 0.60 on the right and 0.96 on the left.  Patient has triphasic waveforms within her tibial arteries on her left lower extremity.  Right lower extremity has monophasic waveforms.  Past Medical History:  Diagnosis Date  . Hyperlipidemia   . Restless legs syndrome (RLS)     Past Surgical History:  Procedure Laterality Date  . ABDOMINAL HYSTERECTOMY    . CHOLECYSTECTOMY    . COLONOSCOPY WITH PROPOFOL N/A 12/19/2014   Procedure: COLONOSCOPY WITH PROPOFOL;  Surgeon: Manya Silvas, MD;  Location: [facility] ENDOSCOPY;  Service: Endoscopy;  Laterality: N/A;  . LOWER EXTREMITY ANGIOGRAPHY Left 11/29/2016   Procedure: Lower Extremity Angiography;  Surgeon: Algernon Huxley, MD;  Location: [facility];  Service: Cardiovascular;  Laterality: Left;    Social History   Socioeconomic History  . Marital status: Widowed    Spouse name: Not on file  . Number of children: Not on file  . Years of education: Not on file  .  Highest education level: Not on file  Occupational History  . Not on file  Social Needs  . Financial resource strain: Not on file  . Food insecurity    Worry: Not on file    Inability: Not on file  . Transportation needs    Medical: Not on file    Non-medical: Not on file  Tobacco Use  . Smoking status: Current Every Day Smoker    Last attempt to quit: 10/30/2016    Years since quitting: 1.9  . Smokeless tobacco: Never Used  Substance and Sexual Activity  . Alcohol use: No  . Drug use: No  . Sexual activity: Not on file  Lifestyle  . Physical activity    Days per week: Not on file    Minutes per session: Not on file  . Stress: Not on file  Relationships  . Social Herbalist on phone: Not on file    Gets together: Not on file    Attends religious service: Not on file    Active member of club or organization: Not on file    Attends meetings of clubs or organizations: Not on file    Relationship status: Not on file  . Intimate partner violence    Fear of current or ex partner: Not on file  Emotionally abused: Not on file    Physically abused: Not on file    Forced sexual activity: Not on file  Other Topics Concern  . Not on file  Social History Narrative  . Not on file    Family History  Problem Relation Age of Onset  . Diabetes Mother   . Hypertension Mother   . Breast cancer Mother 36  . Varicose Veins Maternal Grandmother   . Breast cancer Maternal Aunt     Allergies  Allergen Reactions  . Penicillins     Has patient had a PCN reaction causing immediate rash, facial/tongue/throat swelling, SOB or lightheadedness with hypotension: Yes Has patient had a PCN reaction causing severe rash involving mucus membranes or skin necrosis: No Has patient had a PCN reaction that required hospitalization: No Has patient had a PCN reaction occurring within the last 10 years: No If all of the above answers are "NO", then may proceed with Cephalosporin use.   .  Aspirin Nausea And Vomiting     Review of Systems   Review of Systems: Negative Unless Checked Constitutional: [] Weight loss  [] Fever  [] Chills Cardiac: [] Chest pain   []  Atrial Fibrillation  [] Palpitations   [] Shortness of breath when laying flat   [] Shortness of breath with exertion. [] Shortness of breath at rest Vascular:  [] Pain in legs with walking   [] Pain in legs with standing [x] Pain in legs when laying flat   [] Claudication    [] Pain in feet when laying flat    [] History of DVT   [] Phlebitis   [] Swelling in legs   [] Varicose veins   [] Non-healing ulcers Pulmonary:   [] Uses home oxygen   [] Productive cough   [] Hemoptysis   [] Wheeze  [] COPD   [] Asthma Neurologic:  [] Dizziness   [] Seizures  [] Blackouts [] History of stroke   [] History of TIA  [] Aphasia   [] Temporary Blindness   [] Weakness or numbness in arm   [x] Weakness or numbness in leg Musculoskeletal:   [] Joint swelling   [] Joint pain   [x] Low back pain  []  History of Knee Replacement [] Arthritis [] back Surgeries  []  Spinal Stenosis    Hematologic:  [] Easy bruising  [] Easy bleeding   [] Hypercoagulable state   [] Anemic Gastrointestinal:  [] Diarrhea   [] Vomiting  [] Gastroesophageal reflux/heartburn   [] Difficulty swallowing. [] Abdominal pain Genitourinary:  [] Chronic kidney disease   [] Difficult urination  [] Anuric   [] Blood in urine [] Frequent urination  [] Burning with urination   [] Hematuria Skin:  [] Rashes   [] Ulcers [] Wounds Psychological:  [] History of anxiety   []  History of major depression  []  Memory Difficulties      OBJECTIVE:   Physical Exam  BP 138/79 (BP Location: Right Arm)   Pulse 96   Resp 16   Wt 172 lb (78 kg)   BMI 26.15 kg/m   Gen: WD/WN, NAD Head: [place]/AT, No temporalis wasting.  Ear/Nose/Throat: Hearing grossly intact, nares w/o erythema or drainage Eyes: PER, EOMI, sclera nonicteric.  Neck: Supple, no masses.  No JVD.  Pulmonary:  Good air movement, no use of accessory muscles.  Cardiac: RRR  Vascular:  Vessel Right Left  Radial Palpable Palpable  Dorsalis Pedis Not Palpable Palpable  Posterior Tibial Not Palpable Palpable   Gastrointestinal: soft, non-distended. No guarding/no peritoneal signs.  Musculoskeletal: M/S 5/5 throughout.  No deformity or atrophy.  Neurologic: Pain and light touch intact in extremities.  Symmetrical.  Speech is fluent. Motor exam as listed above. Psychiatric: Judgment intact, Mood & affect appropriate for pt's clinical situation. Dermatologic:  No Venous rashes. No Ulcers Noted.  No changes consistent with cellulitis. Lymph : No Cervical lymphadenopathy, no lichenification or skin changes of chronic lymphedema.       ASSESSMENT AND PLAN:  1. Atherosclerosis of native artery of both lower extremities with intermittent claudication ([place]) Patient pain likely related to her back.  Advised patient to follow up with PCP for further work up  Recommend:  The patient has evidence of atherosclerosis of the lower extremities with claudication.  The patient does not voice lifestyle limiting changes at this point in time.  Noninvasive studies do not suggest clinically significant change.  No invasive studies, angiography or surgery at this time The patient should continue walking and begin a more formal exercise program.  The patient should continue antiplatelet therapy and aggressive treatment of the lipid abnormalities  No changes in the patient's medications at this time  The patient should continue wearing graduated compression socks 10-15 mmHg strength to control the mild edema.    2. Tobacco use disorder Smoking cessation was discussed, 3-10 minutes spent on this topic specifically   3. Hyperlipidemia, unspecified hyperlipidemia type Continue statin as ordered and reviewed, no changes at this time    Current Outpatient Medications on File Prior to Visit  Medication Sig Dispense Refill  . acetaminophen (TYLENOL) 650 MG CR tablet Take 1,300  mg by mouth every 8 (eight) hours as needed for pain.    . Ascorbic Acid (VITAMIN C) 1000 MG tablet Take 1,000 mg by mouth daily.    . clopidogrel (PLAVIX) 75 MG tablet TAKE 1 TABLET BY MOUTH ONCE DAILY 90 tablet 3  . Omega-3 Fatty Acids (FISH OIL) 1000 MG CAPS Take 2,000 mg by mouth 2 (two) times a week.    . simvastatin (ZOCOR) 40 MG tablet Take 40 mg by mouth at bedtime.    . Calcium Carb-Cholecalciferol (CALCIUM 600+D3 PO) Take 1 tablet by mouth once a week.     No current facility-administered medications on file prior to visit.     There are no Patient Instructions on file for this visit. No follow-ups on file.   Kris Hartmann, NP  This note was completed with Sales executive.  Any errors are purely unintentional.

## 2018-10-06 IMAGING — MG MM DIGITAL SCREENING BILAT W/ CAD
4 series · 4 of 4 positions shown · non-contrast
Comparison: Previous exam(s).

CLINICAL DATA: Screening.

EXAM:
DIGITAL SCREENING BILATERAL MAMMOGRAM WITH CAD

[R MLO]
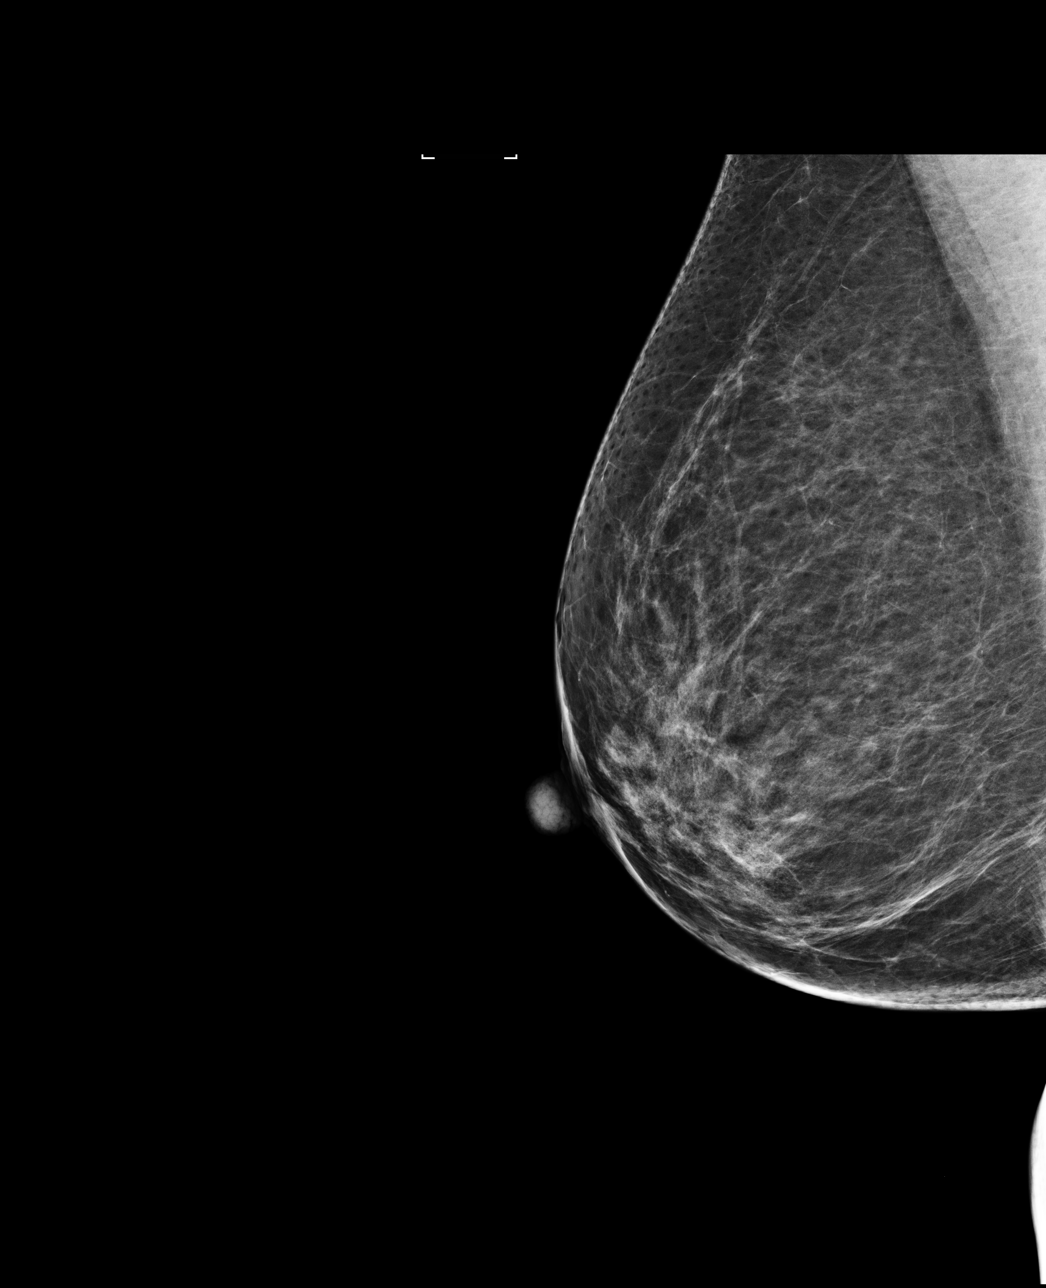

[L CC]
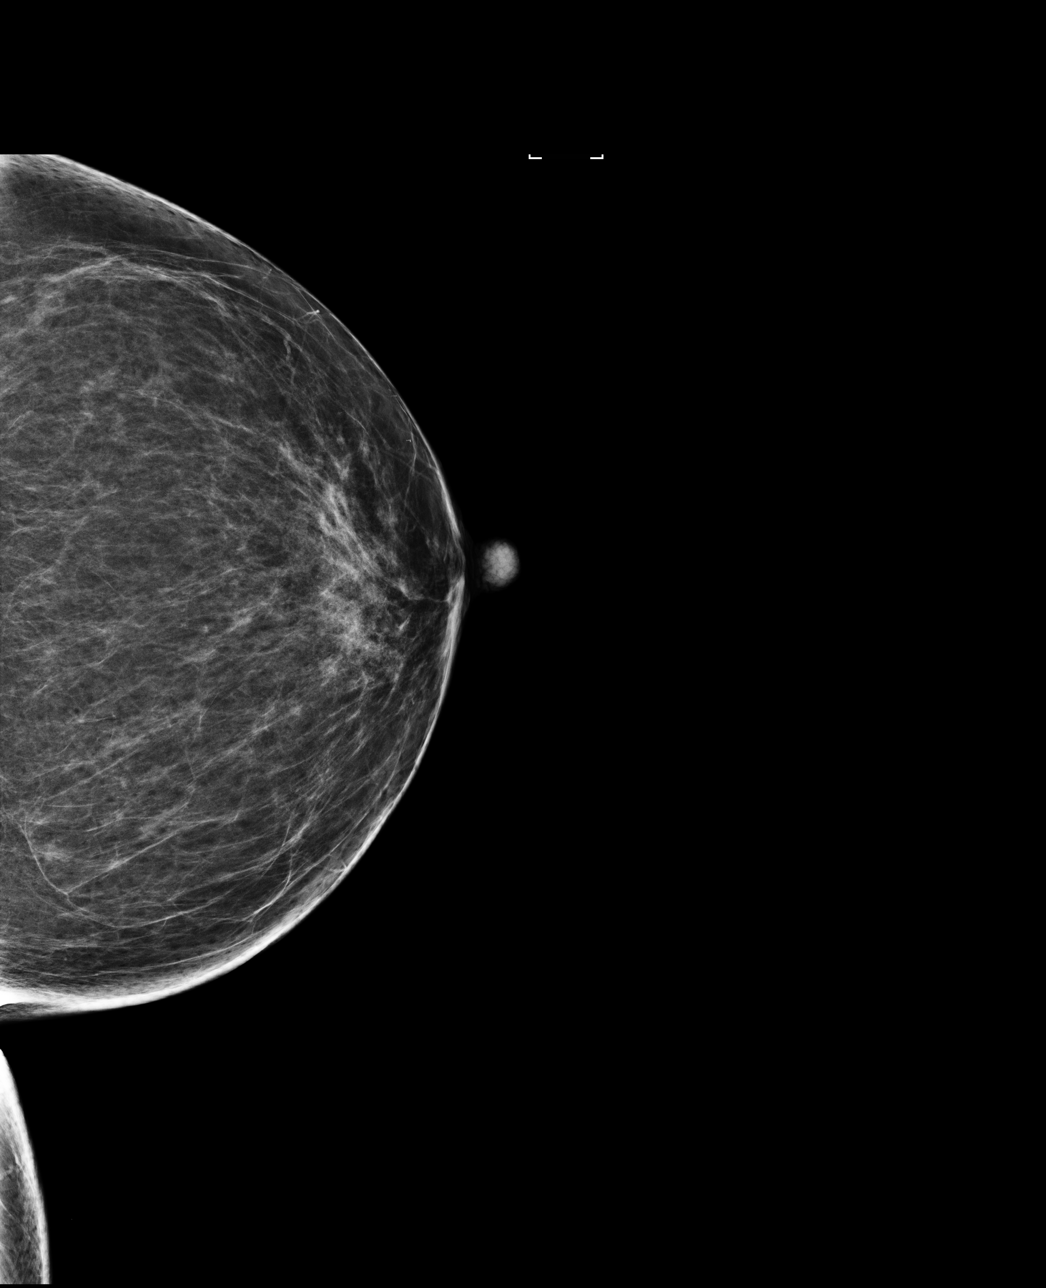

[L MLO]
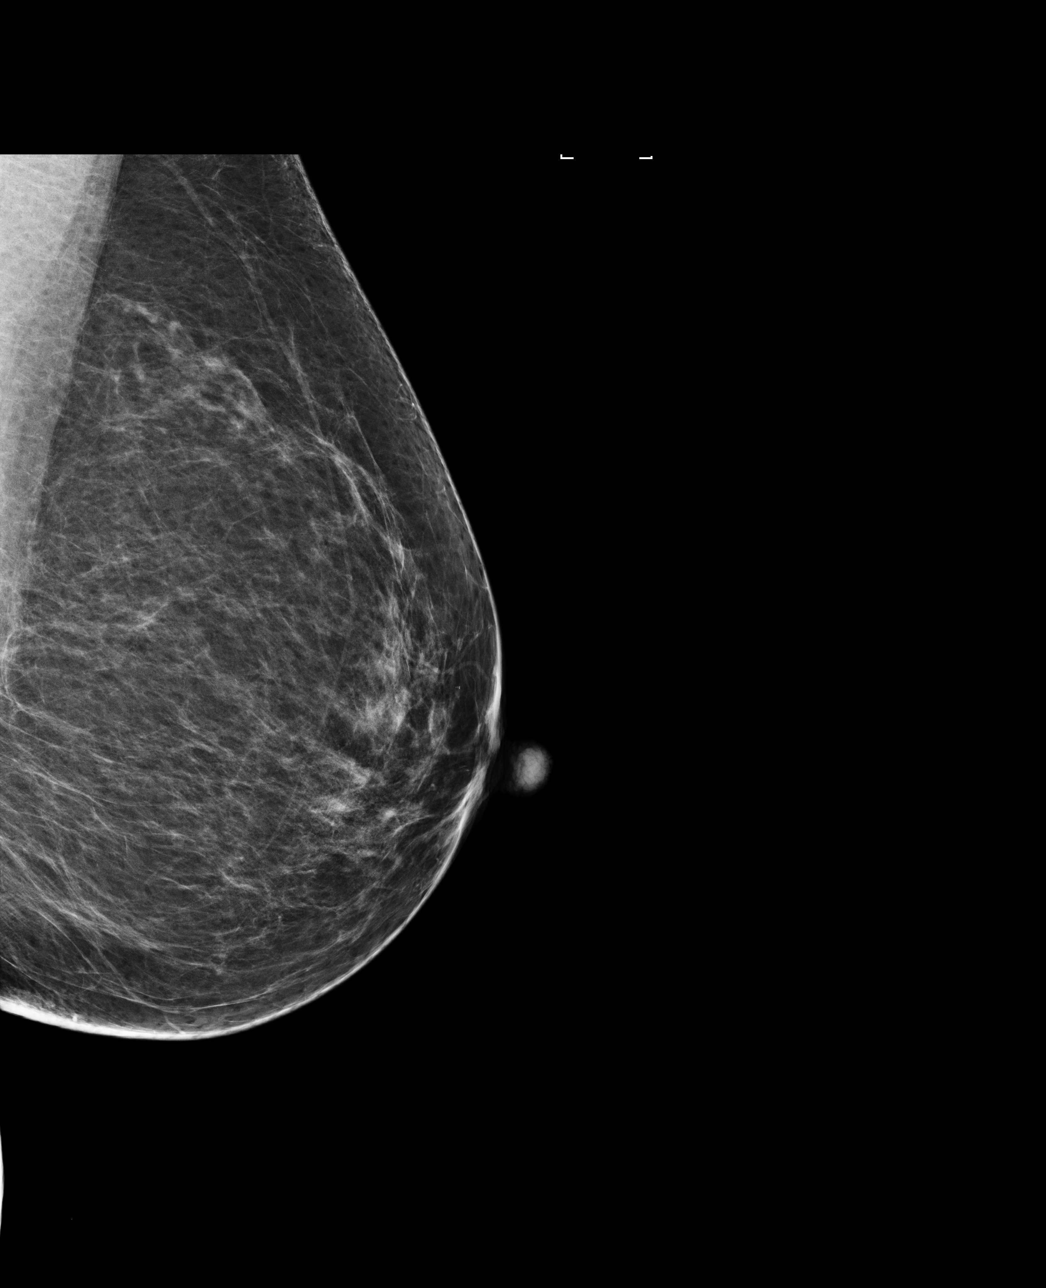

[R CC]
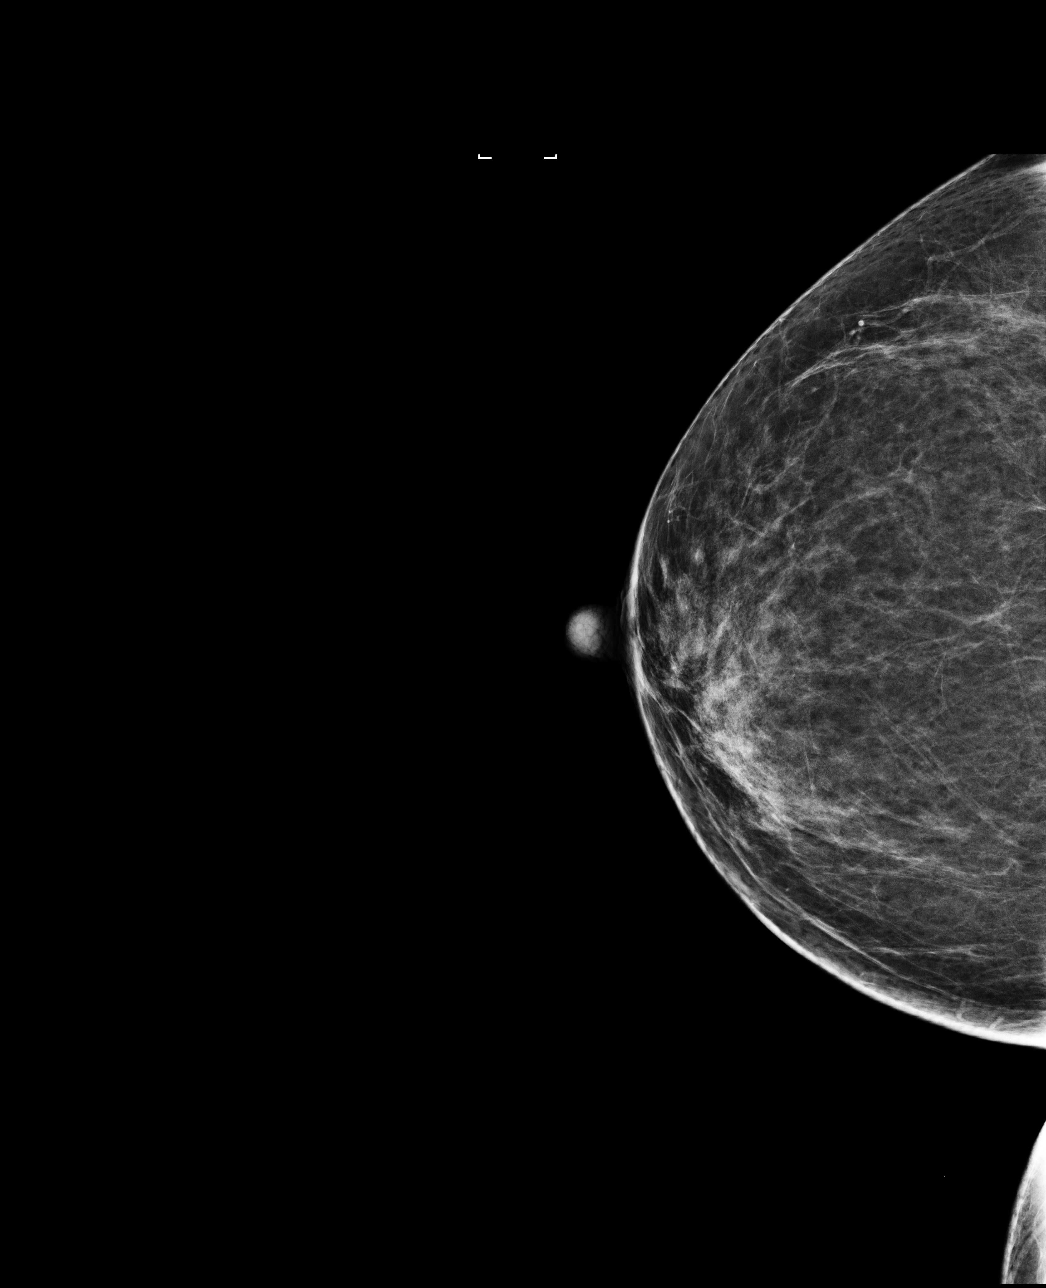

[4 of 4 positions shown; findings below may reference images not displayed]

ACR Breast Density Category b: There are scattered areas of
fibroglandular density.
FINDINGS: There are no findings suspicious for malignancy. Images were
processed with CAD.
IMPRESSION: No mammographic evidence of malignancy. A result letter of this
screening mammogram will be mailed directly to the patient.

RECOMMENDATION:
Screening mammogram in one year. (Code:AS-G-LCT)

BI-RADS CATEGORY  1: Negative.

## 2018-11-16 ENCOUNTER — Other Ambulatory Visit: Payer: Self-pay | Admitting: Family Medicine

## 2018-11-16 ENCOUNTER — Ambulatory Visit
Admission: RE | Admit: 2018-11-16 | Discharge: 2018-11-16 | Disposition: A | Discharge status: Home / Self Care | Payer: Medicare HMO | Attending: Family Medicine | Admitting: Family Medicine

## 2018-11-16 ENCOUNTER — Other Ambulatory Visit: Payer: Self-pay

## 2018-11-16 ENCOUNTER — Ambulatory Visit
Admission: RE | Admit: 2018-11-16 | Discharge: 2018-11-16 | Disposition: A | Discharge status: Home / Self Care | Payer: Medicare HMO | Source: Ambulatory Visit | Attending: Family Medicine | Admitting: Family Medicine

## 2018-11-16 DIAGNOSIS — M5416 Radiculopathy, lumbar region: Secondary | ICD-10-CM | POA: Diagnosis present

## 2018-11-19 DIAGNOSIS — M47816 Spondylosis without myelopathy or radiculopathy, lumbar region: Secondary | ICD-10-CM | POA: Insufficient documentation

## 2018-12-06 ENCOUNTER — Other Ambulatory Visit (INDEPENDENT_AMBULATORY_CARE_PROVIDER_SITE_OTHER): Payer: Self-pay | Admitting: Vascular Surgery

## 2019-01-12 ENCOUNTER — Ambulatory Visit (INDEPENDENT_AMBULATORY_CARE_PROVIDER_SITE_OTHER): Payer: Medicare HMO | Admitting: Nurse Practitioner

## 2019-01-12 ENCOUNTER — Encounter (INDEPENDENT_AMBULATORY_CARE_PROVIDER_SITE_OTHER): Payer: Medicare HMO

## 2019-03-19 ENCOUNTER — Other Ambulatory Visit (INDEPENDENT_AMBULATORY_CARE_PROVIDER_SITE_OTHER): Payer: Self-pay | Admitting: Vascular Surgery

## 2019-03-20 ENCOUNTER — Telehealth (INDEPENDENT_AMBULATORY_CARE_PROVIDER_SITE_OTHER): Payer: Self-pay | Admitting: Nurse Practitioner

## 2019-03-20 ENCOUNTER — Other Ambulatory Visit (INDEPENDENT_AMBULATORY_CARE_PROVIDER_SITE_OTHER): Payer: Self-pay | Admitting: Nurse Practitioner

## 2019-03-20 MED ORDER — CLOPIDOGREL BISULFATE 75 MG PO TABS: 75.0000 mg | ORAL_TABLET | Freq: Every day | ORAL | 3 refills | Status: DC

## 2019-03-26 NOTE — Telephone Encounter (Signed)
NA

## 2019-06-25 ENCOUNTER — Other Ambulatory Visit (INDEPENDENT_AMBULATORY_CARE_PROVIDER_SITE_OTHER): Payer: Self-pay | Admitting: Nurse Practitioner

## 2019-06-25 MED ORDER — CLOPIDOGREL BISULFATE 75 MG PO TABS: 75.0000 mg | ORAL_TABLET | Freq: Every day | ORAL | 3 refills | Status: AC

## 2019-07-17 ENCOUNTER — Other Ambulatory Visit: Payer: Self-pay | Admitting: Family Medicine

## 2019-07-17 DIAGNOSIS — Z1231 Encounter for screening mammogram for malignant neoplasm of breast: Secondary | ICD-10-CM

## 2019-08-23 ENCOUNTER — Other Ambulatory Visit: Payer: Self-pay

## 2019-08-23 ENCOUNTER — Ambulatory Visit
Admission: RE | Admit: 2019-08-23 | Discharge: 2019-08-23 | Disposition: A | Discharge status: Home / Self Care | Payer: Medicare HMO | Source: Ambulatory Visit | Attending: Family Medicine | Admitting: Family Medicine

## 2019-08-23 DIAGNOSIS — Z1231 Encounter for screening mammogram for malignant neoplasm of breast: Secondary | ICD-10-CM | POA: Diagnosis not present

## 2019-10-01 ENCOUNTER — Other Ambulatory Visit (INDEPENDENT_AMBULATORY_CARE_PROVIDER_SITE_OTHER): Payer: Self-pay | Admitting: Nurse Practitioner

## 2019-10-01 DIAGNOSIS — I739 Peripheral vascular disease, unspecified: Secondary | ICD-10-CM

## 2019-10-02 ENCOUNTER — Ambulatory Visit (INDEPENDENT_AMBULATORY_CARE_PROVIDER_SITE_OTHER): Payer: Medicare HMO | Admitting: Vascular Surgery

## 2019-10-02 ENCOUNTER — Ambulatory Visit (INDEPENDENT_AMBULATORY_CARE_PROVIDER_SITE_OTHER): Payer: Medicare HMO

## 2019-10-02 ENCOUNTER — Other Ambulatory Visit: Payer: Self-pay

## 2019-10-02 ENCOUNTER — Encounter (INDEPENDENT_AMBULATORY_CARE_PROVIDER_SITE_OTHER): Payer: Self-pay | Admitting: Vascular Surgery

## 2019-10-02 VITALS — BP 143/73 | HR 101 | Ht 68.0 in | Wt 179.0 lb

## 2019-10-02 DIAGNOSIS — Z9049 Acquired absence of other specified parts of digestive tract: Secondary | ICD-10-CM | POA: Insufficient documentation

## 2019-10-02 DIAGNOSIS — I10 Essential (primary) hypertension: Secondary | ICD-10-CM | POA: Diagnosis not present

## 2019-10-02 DIAGNOSIS — E785 Hyperlipidemia, unspecified: Secondary | ICD-10-CM | POA: Diagnosis not present

## 2019-10-02 DIAGNOSIS — I70213 Atherosclerosis of native arteries of extremities with intermittent claudication, bilateral legs: Secondary | ICD-10-CM

## 2019-10-02 DIAGNOSIS — I739 Peripheral vascular disease, unspecified: Secondary | ICD-10-CM | POA: Diagnosis not present

## 2019-10-02 DIAGNOSIS — F17200 Nicotine dependence, unspecified, uncomplicated: Secondary | ICD-10-CM | POA: Insufficient documentation

## 2019-10-02 NOTE — Patient Instructions (Signed)
Peripheral Vascular Disease  Peripheral vascular disease (PVD) is a disease of the blood vessels that are not part of your heart and brain. A simple term for PVD is poor circulation. In most cases, PVD narrows the blood vessels that carry blood from your heart to the rest of your body. This can reduce the supply of blood to your arms, legs, and internal organs, like your stomach or kidneys. However, PVD most often affects a person's lower legs and feet. Without treatment, PVD tends to get worse. PVD can also lead to acute ischemic limb. This is when an arm or leg suddenly cannot get enough blood. This is a medical emergency. Follow these instructions at home: Lifestyle  Do not use any products that contain nicotine or tobacco, such as cigarettes and e-cigarettes. If you need help quitting, ask your doctor.  Lose weight if you are overweight. Or, stay at a healthy weight as told by your doctor.  Eat a diet that is low in fat and cholesterol. If you need help, ask your doctor.  Exercise regularly. Ask your doctor for activities that are right for you. General instructions  Take over-the-counter and prescription medicines only as told by your doctor.  Take good care of your feet: ? Wear comfortable shoes that fit well. ? Check your feet often for any cuts or sores.  Keep all follow-up visits as told by your doctor This is important. Contact a doctor if:  You have cramps in your legs when you walk.  You have leg pain when you are at rest.  You have coldness in a leg or foot.  Your skin changes.  You are unable to get or have an erection (erectile dysfunction).  You have cuts or sores on your feet that do not heal. Get help right away if:  Your arm or leg turns cold, numb, and blue.  Your arms or legs become red, warm, swollen, painful, or numb.  You have chest pain.  You have trouble breathing.  You suddenly have weakness in your face, arm, or leg.  You become very  confused or you cannot speak.  You suddenly have a very bad headache.  You suddenly cannot see. Summary  Peripheral vascular disease (PVD) is a disease of the blood vessels.  A simple term for PVD is poor circulation. Without treatment, PVD tends to get worse.  Treatment may include exercise, low fat and low cholesterol diet, and quitting smoking. This information is not intended to replace advice given to you by your health care provider. Make sure you discuss any questions you have with your health care provider. Document Revised: 01/07/2017 Document Reviewed: 03/04/2016 Elsevier Patient Education  2020 [employer] Inc[employer]  

## 2019-10-02 NOTE — Assessment & Plan Note (Signed)
blood pressure control important in reducing the progression of atherosclerotic disease. On appropriate oral medications.  

## 2019-10-02 NOTE — Assessment & Plan Note (Signed)
lipid control important in reducing the progression of atherosclerotic disease. Continue statin therapy  

## 2019-10-02 NOTE — Progress Notes (Signed)
MRN : [id number]  Rachel Best is a 74 y.o. ([birth year]) female who presents with chief complaint of  Chief Complaint  Patient presents with  . Follow-up    U/S follow up  .  History of Present Illness: Patient returns today in follow up of her PAD.  She is about 3 years status post left lower extremity revascularization for short distance claudication.  She still has some pain in her left leg particular with activity but this is only mild at this point.  Does not complain of any real pain in the right leg.  ABIs today are stable at 0.66 on the right and 1.1 on the left with biphasic waveforms on the right and by and triphasic waveforms on the left.  Left digital pressure is normal and the right digital pressure is only mildly reduced at 86.  Current Outpatient Medications  Medication Sig Dispense Refill  . acetaminophen (TYLENOL) 650 MG CR tablet Take 1,300 mg by mouth every 8 (eight) hours as needed for pain.    . Calcium Carb-Cholecalciferol (CALCIUM 600+D3 PO) Take 1 tablet by mouth once a week.    . clopidogrel (PLAVIX) 75 MG tablet Take 1 tablet (75 mg total) by mouth daily. 90 tablet 3  . simvastatin (ZOCOR) 40 MG tablet Take 40 mg by mouth at bedtime.    . Ascorbic Acid (VITAMIN C) 1000 MG tablet Take 1,000 mg by mouth daily. (Patient not taking: Reported on 10/02/2019)    . Omega-3 Fatty Acids (FISH OIL) 1000 MG CAPS Take 2,000 mg by mouth 2 (two) times a week. (Patient not taking: Reported on 10/02/2019)     No current facility-administered medications for this visit.    Past Medical History:  Diagnosis Date  . Hyperlipidemia   . Restless legs syndrome (RLS)     Past Surgical History:  Procedure Laterality Date  . ABDOMINAL HYSTERECTOMY    . CHOLECYSTECTOMY    . COLONOSCOPY WITH PROPOFOL N/A 12/19/2014   Procedure: COLONOSCOPY WITH PROPOFOL;  Surgeon: Manya Silvas, MD;  Location: [facility] ENDOSCOPY;  Service: Endoscopy;  Laterality: N/A;  . LOWER EXTREMITY  ANGIOGRAPHY Left 11/29/2016   Procedure: Lower Extremity Angiography;  Surgeon: Algernon Huxley, MD;  Location: [facility];  Service: Cardiovascular;  Laterality: Left;     Social History   Tobacco Use  . Smoking status: Current Every Day Smoker    Last attempt to quit: 10/30/2016    Years since quitting: 2.9  . Smokeless tobacco: Never Used  Vaping Use  . Vaping Use: Never used  Substance Use Topics  . Alcohol use: No  . Drug use: No      Family History  Problem Relation Age of Onset  . Diabetes Mother   . Hypertension Mother   . Breast cancer Mother 30  . Varicose Veins Maternal Grandmother   . Breast cancer Maternal Aunt   . Breast cancer Cousin        mat cousin    Allergies  Allergen Reactions  . Penicillins     Has patient had a PCN reaction causing immediate rash, facial/tongue/throat swelling, SOB or lightheadedness with hypotension: Yes Has patient had a PCN reaction causing severe rash involving mucus membranes or skin necrosis: No Has patient had a PCN reaction that required hospitalization: No Has patient had a PCN reaction occurring within the last 10 years: No If all of the above answers are "NO", then may proceed with Cephalosporin use.   Marland Kitchen  Aspirin Nausea And Vomiting     REVIEW OF SYSTEMS (Negative unless checked)  Constitutional: [] Weight loss  [] Fever  [] Chills Cardiac: [] Chest pain   [] Chest pressure   [] Palpitations   [] Shortness of breath when laying flat   [] Shortness of breath at rest   [] Shortness of breath with exertion. Vascular:  [x] Pain in legs with walking   [] Pain in legs at rest   [] Pain in legs when laying flat   [x] Claudication   [] Pain in feet when walking  [] Pain in feet at rest  [] Pain in feet when laying flat   [] History of DVT   [] Phlebitis   [] Swelling in legs   [] Varicose veins   [] Non-healing ulcers Pulmonary:   [] Uses home oxygen   [] Productive cough   [] Hemoptysis   [] Wheeze  [] COPD   [] Asthma Neurologic:  [] Dizziness   [] Blackouts   [] Seizures   [] History of stroke   [] History of TIA  [] Aphasia   [] Temporary blindness   [] Dysphagia   [] Weakness or numbness in arms   [] Weakness or numbness in legs Musculoskeletal:  [x] Arthritis   [] Joint swelling   [x] Joint pain   [] Low back pain Hematologic:  [] Easy bruising  [] Easy bleeding   [] Hypercoagulable state   [] Anemic   Gastrointestinal:  [] Blood in stool   [] Vomiting blood  [x] Gastroesophageal reflux/heartburn   [] Abdominal pain Genitourinary:  [] Chronic kidney disease   [] Difficult urination  [] Frequent urination  [] Burning with urination   [] Hematuria Skin:  [] Rashes   [] Ulcers   [] Wounds Psychological:  [] History of anxiety   []  History of major depression.  Physical Examination  BP (!) 143/73   Pulse (!) 101   Ht 5\' 8"  (1.727 m)   Wt 179 lb (81.2 kg)   BMI 27.22 kg/m  Gen:  WD/WN, NAD. Appears younger than stated age Head: [place]/AT, No temporalis wasting. Ear/Nose/Throat: Hearing grossly intact, nares w/o erythema or drainage Eyes: Conjunctiva clear. Sclera non-icteric Neck: Supple.  Trachea midline Pulmonary:  Good air movement, no use of accessory muscles.  Cardiac: RRR, no JVD Vascular:  Vessel Right Left  Radial Palpable Palpable                          PT 1+ Palpable 2+ Palpable  DP 1+ Palpable 2+ Palpable   Gastrointestinal: soft, non-tender/non-distended. No guarding/reflex.  Musculoskeletal: M/S 5/5 throughout.  No deformity or atrophy. Trace LE edema. Neurologic: Sensation grossly intact in extremities.  Symmetrical.  Speech is fluent.  Psychiatric: Judgment intact, Mood & affect appropriate for pt's clinical situation. Dermatologic: No rashes or ulcers noted.  No cellulitis or open wounds.       Labs No results found for this or any previous visit (from the past 2160 hour(s)).  Radiology No results found.  Assessment/Plan  Essential hypertension blood pressure control important in reducing the progression of  atherosclerotic disease. On appropriate oral medications.   Hyperlipidemia lipid control important in reducing the progression of atherosclerotic disease. Continue statin therapy   Atherosclerosis of native arteries of extremity with intermittent claudication (HCC) ABIs today are stable at 0.66 on the right and 1.1 on the left with biphasic waveforms on the right and by and triphasic waveforms on the left.  Left digital pressure is normal and the right digital pressure is only mildly reduced at 86.  Symptoms are stable.  No role for intervention at this time.  Continue current medical regimen including Plavix and a statin agent.  Recheck in 1 year.  Leotis Pain, MD  10/02/2019 1:39 PM    This note was created with Dragon medical transcription system.  Any errors from dictation are purely unintentional

## 2019-10-02 NOTE — Assessment & Plan Note (Signed)
ABIs today are stable at 0.66 on the right and 1.1 on the left with biphasic waveforms on the right and by and triphasic waveforms on the left.  Left digital pressure is normal and the right digital pressure is only mildly reduced at 86.  Symptoms are stable.  No role for intervention at this time.  Continue current medical regimen including Plavix and a statin agent.  Recheck in 1 year.

## 2020-05-08 ENCOUNTER — Other Ambulatory Visit: Payer: Self-pay | Admitting: Family Medicine

## 2020-05-08 DIAGNOSIS — Z780 Asymptomatic menopausal state: Secondary | ICD-10-CM

## 2020-09-30 ENCOUNTER — Ambulatory Visit (INDEPENDENT_AMBULATORY_CARE_PROVIDER_SITE_OTHER): Payer: Medicare HMO

## 2020-09-30 ENCOUNTER — Other Ambulatory Visit: Payer: Self-pay

## 2020-09-30 ENCOUNTER — Ambulatory Visit (INDEPENDENT_AMBULATORY_CARE_PROVIDER_SITE_OTHER): Payer: Medicare HMO | Admitting: Vascular Surgery

## 2020-09-30 VITALS — BP 150/77 | HR 93 | Resp 16 | Wt 185.4 lb

## 2020-09-30 DIAGNOSIS — I70213 Atherosclerosis of native arteries of extremities with intermittent claudication, bilateral legs: Secondary | ICD-10-CM

## 2020-09-30 DIAGNOSIS — I10 Essential (primary) hypertension: Secondary | ICD-10-CM

## 2020-09-30 DIAGNOSIS — E785 Hyperlipidemia, unspecified: Secondary | ICD-10-CM | POA: Diagnosis not present

## 2020-09-30 NOTE — Progress Notes (Signed)
MRN : [id number]  Rachel Best is a 75 y.o. (19-Jul-1945) female who presents with chief complaint of  Chief Complaint  Patient presents with   Follow-up    Ultrasound follow up  .  History of Present Illness: Patient returns today in follow up of her PAD.  She is about 4 years status post left lower extremity intervention for significant peripheral arterial disease.  She still has a lot of arthritic pain in that left leg and difficulty walking predominantly with the left.  She does not do a ton of walking and does not have any disabling claudication symptoms.  Her ABIs today are up to 0.79 on the right and stable at 1.08 on the left with biphasic waveforms bilaterally.  Current Outpatient Medications  Medication Sig Dispense Refill   acetaminophen (TYLENOL) 650 MG CR tablet Take 1,300 mg by mouth every 8 (eight) hours as needed for pain.     Calcium Carb-Cholecalciferol (CALCIUM 600+D3 PO) Take 1 tablet by mouth once a week.     clopidogrel (PLAVIX) 75 MG tablet Take 1 tablet (75 mg total) by mouth daily. 90 tablet 3   losartan (COZAAR) 25 MG tablet Take 1 tablet by mouth daily.     simvastatin (ZOCOR) 40 MG tablet Take 40 mg by mouth at bedtime.     Ascorbic Acid (VITAMIN C) 1000 MG tablet Take 1,000 mg by mouth daily. (Patient not taking: No sig reported)     Omega-3 Fatty Acids (FISH OIL) 1000 MG CAPS Take 2,000 mg by mouth 2 (two) times a week. (Patient not taking: No sig reported)     No current facility-administered medications for this visit.    Past Medical History:  Diagnosis Date   Hyperlipidemia    Restless legs syndrome (RLS)     Past Surgical History:  Procedure Laterality Date   ABDOMINAL HYSTERECTOMY     CHOLECYSTECTOMY     COLONOSCOPY WITH PROPOFOL N/A 12/19/2014   Procedure: COLONOSCOPY WITH PROPOFOL;  Surgeon: Manya Silvas, MD;  Location[facility] ENDOSCOPY;  Service: Endoscopy;  Laterality: N/A;   LOWER EXTREMITY ANGIOGRAPHY Left 11/29/2016   Procedure:  Lower Extremity Angiography;  Surgeon: Algernon Huxley, MD;  Location: [place] CV LAB;  Service: Cardiovascular;  Laterality: Left;     Social History   Tobacco Use   Smoking status: Every Day    Types: Cigarettes    Last attempt to quit: 10/30/2016    Years since quitting: 3.9   Smokeless tobacco: Never  Vaping Use   Vaping Use: Never used  Substance Use Topics   Alcohol use: No   Drug use: No      Family History  Problem Relation Age of Onset   Diabetes Mother    Hypertension Mother    Breast cancer Mother 94   Varicose Veins Maternal Grandmother    Breast cancer Maternal Aunt    Breast cancer Cousin        mat cousin     Allergies  Allergen Reactions   Penicillins     Has patient had a PCN reaction causing immediate rash, facial/tongue/throat swelling, SOB or lightheadedness with hypotension: Yes Has patient had a PCN reaction causing severe rash involving mucus membranes or skin necrosis: No Has patient had a PCN reaction that required hospitalization: No Has patient had a PCN reaction occurring within the last 10 years: No If all of the above answers are "NO", then may proceed with Cephalosporin use.    Aspirin  Nausea And Vomiting    REVIEW OF SYSTEMS (Negative unless checked)   Constitutional: '[]'$ Weight loss  '[]'$ Fever  '[]'$ Chills Cardiac: '[]'$ Chest pain   '[]'$ Chest pressure   '[]'$ Palpitations   '[]'$ Shortness of breath when laying flat   '[]'$ Shortness of breath at rest   '[]'$ Shortness of breath with exertion. Vascular:  '[x]'$ Pain in legs with walking   '[]'$ Pain in legs at rest   '[]'$ Pain in legs when laying flat   '[x]'$ Claudication   '[]'$ Pain in feet when walking  '[]'$ Pain in feet at rest  '[]'$ Pain in feet when laying flat   '[]'$ History of DVT   '[]'$ Phlebitis   '[]'$ Swelling in legs   '[]'$ Varicose veins   '[]'$ Non-healing ulcers Pulmonary:   '[]'$ Uses home oxygen   '[]'$ Productive cough   '[]'$ Hemoptysis   '[]'$ Wheeze  '[]'$ COPD   '[]'$ Asthma Neurologic:  '[]'$ Dizziness  '[]'$ Blackouts   '[]'$ Seizures   '[]'$ History of stroke    '[]'$ History of TIA  '[]'$ Aphasia   '[]'$ Temporary blindness   '[]'$ Dysphagia   '[]'$ Weakness or numbness in arms   '[]'$ Weakness or numbness in legs Musculoskeletal:  '[x]'$ Arthritis   '[]'$ Joint swelling   '[x]'$ Joint pain   '[]'$ Low back pain Hematologic:  '[]'$ Easy bruising  '[]'$ Easy bleeding   '[]'$ Hypercoagulable state   '[]'$ Anemic   Gastrointestinal:  '[]'$ Blood in stool   '[]'$ Vomiting blood  '[x]'$ Gastroesophageal reflux/heartburn   '[]'$ Abdominal pain Genitourinary:  '[]'$ Chronic kidney disease   '[]'$ Difficult urination  '[]'$ Frequent urination  '[]'$ Burning with urination   '[]'$ Hematuria Skin:  '[]'$ Rashes   '[]'$ Ulcers   '[]'$ Wounds Psychological:  '[]'$ History of anxiety   '[]'$  History of major depression.  Physical Examination  BP (!) 150/77 (BP Location: Right Arm)   Pulse 93   Resp 16   Wt 185 lb 6.4 oz (84.1 kg)   BMI 28.19 kg/m  Gen:  WD/WN, NAD Head: [place]/AT, No temporalis wasting. Ear/Nose/Throat: Hearing grossly intact, nares w/o erythema or drainage Eyes: Conjunctiva clear. Sclera non-icteric Neck: Supple.  Trachea midline Pulmonary:  Good air movement, no use of accessory muscles.  Cardiac: RRR, no JVD Vascular:  Vessel Right Left  Radial Palpable Palpable                          PT 1+ Palpable 2+ Palpable  DP 2+ Palpable 2+ Palpable   Gastrointestinal: soft, non-tender/non-distended. No guarding/reflex.  Musculoskeletal: M/S 5/5 throughout.  No deformity or atrophy. Trace LLE edema. Neurologic: Sensation grossly intact in extremities.  Symmetrical.  Speech is fluent.  Psychiatric: Judgment intact, Mood & affect appropriate for pt's clinical situation. Dermatologic: No rashes or ulcers noted.  No cellulitis or open wounds.      Labs No results found for this or any previous visit (from the past 2160 hour(s)).  Radiology No results found.  Assessment/Plan Essential hypertension blood pressure control important in reducing the progression of atherosclerotic disease. On appropriate oral medications.      Hyperlipidemia lipid control important in reducing the progression of atherosclerotic disease. Continue statin therapy  Atherosclerosis of native arteries of extremity with intermittent claudication (HCC) Her ABIs today are up to 0.79 on the right and stable at 1.08 on the left with biphasic waveforms bilaterally.  Symptoms are not currently that concerning.  No role for intervention at this time.  Recheck in 1 year.  Continue current medical regimen.    Leotis Pain, MD  09/30/2020 4:06 PM    This note was created with Dragon medical transcription system.  Any errors from dictation are purely unintentional

## 2020-09-30 NOTE — Assessment & Plan Note (Signed)
Her ABIs today are up to 0.79 on the right and stable at 1.08 on the left with biphasic waveforms bilaterally.  Symptoms are not currently that concerning.  No role for intervention at this time.  Recheck in 1 year.  Continue current medical regimen.

## 2020-10-29 ENCOUNTER — Other Ambulatory Visit: Payer: Self-pay | Admitting: Family Medicine

## 2020-10-29 DIAGNOSIS — Z1231 Encounter for screening mammogram for malignant neoplasm of breast: Secondary | ICD-10-CM

## 2021-01-19 ENCOUNTER — Other Ambulatory Visit: Payer: Self-pay | Admitting: *Deleted

## 2021-01-19 DIAGNOSIS — Z87891 Personal history of nicotine dependence: Secondary | ICD-10-CM

## 2021-01-19 DIAGNOSIS — F17210 Nicotine dependence, cigarettes, uncomplicated: Secondary | ICD-10-CM

## 2021-01-26 ENCOUNTER — Other Ambulatory Visit: Payer: Self-pay

## 2021-01-26 ENCOUNTER — Emergency Department: Payer: Medicare HMO

## 2021-01-26 ENCOUNTER — Emergency Department
Admission: EM | Admit: 2021-01-26 | Discharge: 2021-01-26 | Disposition: A | Discharge status: Home / Self Care | Payer: Medicare HMO | Attending: Emergency Medicine | Admitting: Emergency Medicine

## 2021-01-26 DIAGNOSIS — M25511 Pain in right shoulder: Secondary | ICD-10-CM

## 2021-01-26 DIAGNOSIS — M898X1 Other specified disorders of bone, shoulder: Secondary | ICD-10-CM

## 2021-01-26 DIAGNOSIS — F17210 Nicotine dependence, cigarettes, uncomplicated: Secondary | ICD-10-CM | POA: Diagnosis not present

## 2021-01-26 DIAGNOSIS — M546 Pain in thoracic spine: Secondary | ICD-10-CM | POA: Diagnosis not present

## 2021-01-26 DIAGNOSIS — J449 Chronic obstructive pulmonary disease, unspecified: Secondary | ICD-10-CM | POA: Diagnosis not present

## 2021-01-26 DIAGNOSIS — I10 Essential (primary) hypertension: Secondary | ICD-10-CM | POA: Insufficient documentation

## 2021-01-26 LAB — D-DIMER, QUANTITATIVE: D-Dimer, Quant: 0.57 ug/mL-FEU — ABNORMAL HIGH (ref 0.00–0.50)

## 2021-01-26 LAB — BASIC METABOLIC PANEL
Anion gap: 5 (ref 5–15)
BUN: 14 mg/dL (ref 8–23)
CO2: 27 mmol/L (ref 22–32)
Calcium: 9.6 mg/dL (ref 8.9–10.3)
Chloride: 107 mmol/L (ref 98–111)
Creatinine, Ser: 0.78 mg/dL (ref 0.44–1.00)
GFR, Estimated: >60 mL/min (ref >60)
Glucose, Bld: 127 mg/dL — ABNORMAL HIGH (ref 70–99)
Potassium: 3.8 mmol/L (ref 3.5–5.1)
Sodium: 139 mmol/L (ref 135–145)

## 2021-01-26 LAB — CBC WITH DIFFERENTIAL/PLATELET
Abs Immature Granulocytes: 0.05 10*3/uL (ref 0.00–0.07)
Basophils Absolute: 0.1 10*3/uL (ref 0.0–0.1)
Basophils Relative: 1 %
Eosinophils Absolute: 0.2 10*3/uL (ref 0.0–0.5)
Eosinophils Relative: 2 %
HCT: 41.6 % (ref 36.0–46.0)
Hemoglobin: 13.9 g/dL (ref 12.0–15.0)
Immature Granulocytes: 1 %
Lymphocytes Relative: 39 %
Lymphs Abs: 4.1 10*3/uL — ABNORMAL HIGH (ref 0.7–4.0)
MCH: 29.6 pg (ref 26.0–34.0)
MCHC: 33.4 g/dL (ref 30.0–36.0)
MCV: 88.7 fL (ref 80.0–100.0)
Monocytes Absolute: 0.8 10*3/uL (ref 0.1–1.0)
Monocytes Relative: 7 %
Neutro Abs: 5.4 10*3/uL (ref 1.7–7.7)
Neutrophils Relative %: 50 %
Platelets: 340 10*3/uL (ref 150–400)
RBC: 4.69 MIL/uL (ref 3.87–5.11)
RDW: 13.6 % (ref 11.5–15.5)
WBC: 10.7 10*3/uL — ABNORMAL HIGH (ref 4.0–10.5)
nRBC: 0 % (ref 0.0–0.2)

## 2021-01-26 LAB — TROPONIN I (HIGH SENSITIVITY)
Troponin I (High Sensitivity): 32 ng/L — ABNORMAL HIGH (ref <18)
Troponin I (High Sensitivity): 24 ng/L — ABNORMAL HIGH (ref <18)

## 2021-01-26 MED ORDER — IOHEXOL 350 MG/ML SOLN
75.0000 mL | Freq: Once | INTRAVENOUS | Status: AC | PRN
  Administered 2021-01-26: 03:00:00 75 mL via INTRAVENOUS

## 2021-01-26 NOTE — ED Triage Notes (Signed)
Pt states that she has pain to her right upper back under her shoulder blade. Pt denies chest pain or any accompanying symptoms. Pt denies any knowledge of injuries to area,.

## 2021-01-26 NOTE — ED Notes (Signed)
EKG performed per EDP verbal request.

## 2021-01-26 NOTE — ED Notes (Signed)
CT notified pt has IV access

## 2021-01-26 NOTE — ED Provider Notes (Signed)
[facility] Emergency Department Provider Note   ____________________________________________   Event Date/Time   First MD Initiated Contact with Patient 01/26/21 [phone]     (approximate)  I have reviewed the triage vital signs and the nursing notes.   HISTORY  Chief Complaint Back Pain    HPI Rachel Best is a 75 y.o. female who presents for right shoulder pain  LOCATION: Right shoulder blade DURATION: 1 day prior to arrival TIMING: Stable since onset SEVERITY: 6/10 QUALITY: Aching pain CONTEXT: Patient states that when she woke up this morning she began to feel a throbbing pain underneath her right shoulder blade and between the shoulder blade and her back.  Patient denies any recent trauma MODIFYING FACTORS: Movement of the right arm and laying on her back worsens this pain.  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   Hyperlipidemia 03/12/2016   Tobacco use disorder 03/12/2016   Atherosclerosis of native arteries of extremity with intermittent claudication ([place]) 03/12/2016   Cataract of both eyes 10/16/2015   Essential hypertension 10/16/2015   Glaucoma suspect of both eyes 10/16/2015   Hypercholesterolemia 10/16/2015   History of colonic polyps 12/30/2014   Glaucoma 12/06/2013    Past Surgical History:  Procedure Laterality Date   ABDOMINAL HYSTERECTOMY     CHOLECYSTECTOMY     COLONOSCOPY WITH  PROPOFOL N/A 12/19/2014   Procedure: COLONOSCOPY WITH PROPOFOL;  Surgeon: Manya Silvas, MD;  Location: [place];  Service: Endoscopy;  Laterality: N/A;   LOWER EXTREMITY ANGIOGRAPHY Left 11/29/2016   Procedure: Lower Extremity Angiography;  Surgeon: Algernon Huxley, MD;  Location: [place] [facility];  Service: Cardiovascular;  Laterality: Left;    Prior to Admission medications   Medication Sig Start Date End Date Taking? Authorizing Provider  acetaminophen (TYLENOL) 650 MG CR tablet Take 1,300 mg by mouth every 8 (eight) hours as needed for pain.    [provider]  Ascorbic Acid (VITAMIN C) 1000 MG tablet Take 1,000 mg by mouth daily. Patient not taking: No sig reported    [provider]  Calcium Carb-Cholecalciferol (CALCIUM 600+D3 PO) Take 1 tablet by mouth once a week.    [provider]  clopidogrel (PLAVIX) 75 MG tablet Take 1 tablet (75 mg total) by mouth daily. 06/25/19   Kris Hartmann, NP  losartan (COZAAR) 25 MG tablet Take 1 tablet by mouth daily. 06/18/20 06/18/21  [provider]  Omega-3 Fatty Acids (FISH OIL) 1000 MG CAPS Take 2,000 mg by mouth 2 (two) times a week. Patient not taking: No sig reported    [provider]  simvastatin (ZOCOR) 40 MG tablet Take 40 mg by mouth at bedtime. 11/08/16   [provider]    Allergies Penicillins and Aspirin  Family History  Problem Relation Age of Onset   Diabetes Mother    Hypertension Mother    Breast cancer Mother 88   Varicose Veins  Maternal Grandmother    Breast cancer Maternal Aunt    Breast cancer Cousin        mat cousin    Social History Social History   Tobacco Use   Smoking status: Every Day    Types: Cigarettes    Last attempt to quit: 10/30/2016    Years since quitting: 4.2   Smokeless tobacco: Never  Vaping Use   Vaping Use: Never used  Substance Use Topics   Alcohol use: No   Drug use: No    Review of Systems Constitutional: No  fever/chills Eyes: No visual changes. ENT: No sore throat. Cardiovascular: Denies chest pain. Respiratory: Denies shortness of breath. Gastrointestinal: No abdominal pain.  No nausea, no vomiting.  No diarrhea. Genitourinary: Negative for dysuria. Musculoskeletal: Positive for acute right shoulder pain Skin: Negative for rash. Neurological: Negative for headaches, weakness/numbness/paresthesias in any extremity Psychiatric: Negative for suicidal ideation/homicidal ideation   ____________________________________________   PHYSICAL EXAM:  VITAL SIGNS: ED Triage Vitals  Enc Vitals Group     BP 01/26/21 0142 (!) 187/88     Pulse Rate 01/26/21 0142 90     Resp 01/26/21 0142 17     Temp 01/26/21 0142 98.3 F (36.8 C)     Temp Source 01/26/21 0142 Oral     SpO2 01/26/21 0142 97 %     Weight 01/26/21 0142 182 lb (82.6 kg)     Height 01/26/21 0142 5\' 8"  (1.727 m)     Head Circumference --      Peak Flow --      Pain Score 01/26/21 0145 5     Pain Loc --      Pain Edu? --      Excl. in [place]? --    Constitutional: Alert and oriented. Well appearing and in no acute distress. Eyes: Conjunctivae are normal. PERRL. Head: Atraumatic. Nose: No congestion/rhinnorhea. Mouth/Throat: Mucous membranes are moist. Neck: No stridor Cardiovascular: Grossly normal heart sounds.  Good peripheral circulation. Respiratory: Normal respiratory effort.  No retractions. Gastrointestinal: Soft and nontender. No distention. Musculoskeletal: No obvious deformities.  Tenderness to palpation medial and inferior aspects of the right shoulder blade Neurologic:  Normal speech and language. No gross focal neurologic deficits are appreciated. Skin:  Skin is warm and dry. No rash noted. Psychiatric: Mood and affect are normal. Speech and behavior are normal.  ____________________________________________   LABS (all labs ordered are listed, but only abnormal results are displayed)  Labs Reviewed  CBC WITH  DIFFERENTIAL/PLATELET - Abnormal; Notable for the following components:      Result Value   WBC 10.7 (*)    Lymphs Abs 4.1 (*)    All other components within normal limits  BASIC METABOLIC PANEL - Abnormal; Notable for the following components:   Glucose, Bld 127 (*)    All other components within normal limits  D-DIMER, QUANTITATIVE (NOT AT [facility]) - Abnormal; Notable for the following components:   D-Dimer, Quant 0.57 (*)    All other components within normal limits  TROPONIN I (HIGH SENSITIVITY) - Abnormal; Notable for the following components:   Troponin I (High Sensitivity) 32 (*)    All other components within normal limits  TROPONIN I (HIGH SENSITIVITY) - Abnormal; Notable for the following components:   Troponin I (High Sensitivity) 24 (*)    All other components within normal limits   ____________________________________________  EKG  ED ECG REPORT I, Naaman Plummer, the attending physician, personally viewed and interpreted this ECG.  Date:  01/26/2021 EKG Time: 0639 Rate: 60 Rhythm: normal sinus rhythm QRS Axis: normal Intervals: normal ST/T Wave abnormalities: normal Narrative Interpretation: no evidence of acute ischemia  ____________________________________________  RADIOLOGY  ED MD interpretation: 2 view chest x-ray shows no evidence of acute abnormalities including no pneumonia, pneumothorax, or widened mediastinum.  There is evidence of mildly prominent heart silhouette without findings of CHF concerning for pericardial effusion  CT angiography of the chest shows no pulmonary emboli, mild coronary artery calcification, left ventricular hypertrophy, stable cardiomegaly, slight interval increase in small pericardial effusion without evidence of cardiac tamponade  Official radiology report(s): DG Chest 2 View  Result Date: 01/26/2021 CLINICAL DATA:  Right upper back pain. EXAM: CHEST - 2 VIEW COMPARISON:  Chest CT 10/20/2016 FINDINGS: There is a mildly  enlarged heart silhouette, previously with exaggerated due to pericardial effusion and the probably is still at least a small pericardial effusion anteriorly based on the lateral projection. No vascular congestion is seen. The lungs are clear. No pleural effusion is evident. There is calcification in the aortic arch. Mild osteopenia and thoracic spondylosis. IMPRESSION: Mildly prominent heart silhouette without findings of CHF. This could in part be due to pericardial effusion, anterior measured thickness of which is estimated 1.4 cm. This appears similar to the prior CT. The lungs are clear. Electronically Signed   By: Telford Nab M.D.   On: 01/26/2021 02:14   CT Angio Chest PE W/Cm &/Or Wo Cm  Result Date: 01/26/2021 CLINICAL DATA:  Pulmonary embolism (PE) suspected, positive D-dimer. Right back pain. EXAM: CT ANGIOGRAPHY CHEST WITH CONTRAST TECHNIQUE: Multidetector CT imaging of the chest was performed using the standard protocol during bolus administration of intravenous contrast. Multiplanar CT image reconstructions and MIPs were obtained to evaluate the vascular anatomy. CONTRAST:  40mL OMNIPAQUE IOHEXOL 350 MG/ML SOLN COMPARISON:  10/20/2016 FINDINGS: Cardiovascular: There is adequate opacification of the pulmonary arterial tree. No intraluminal filling defect identified to suggest acute pulmonary embolism. The central pulmonary arteries are of normal caliber. Mild cardiomegaly with left ventricular hypertrophy noted. Small pericardial effusion is present without CT evidence of cardiac tamponade. Pericardial effusion has minimally increased in size since prior examination. Mild coronary artery calcification. Mild atherosclerotic calcification within the thoracic aorta. No aortic aneurysm. Mediastinum/Nodes: No enlarged mediastinal, hilar, or axillary lymph nodes. Thyroid gland, trachea, and esophagus demonstrate no significant findings. Lungs/Pleura: Lungs are clear. No pleural effusion or  pneumothorax. Upper Abdomen: Multiple simple cysts are seen within the visualized liver. No acute abnormality. Musculoskeletal: No acute bone abnormality. Degenerative changes are seen within the thoracic spine with resultant severe bilateral neuroforaminal narrowing at T9-10. No lytic or blastic bone lesion. Review of the MIP images confirms the above findings. IMPRESSION: No pulmonary embolism. Mild coronary artery calcification. Left ventricular hypertrophy. Stable cardiomegaly. Slight interval increase in small pericardial effusion. No CT evidence of cardiac tamponade. Advanced degenerative changes within the lower thoracic spine resulting in multilevel neuroforaminal narrowing, most severe at T9-10. Aortic Atherosclerosis (ICD10-I70.0). Electronically Signed   By: Fidela Salisbury M.D.   On: 01/26/2021 03:46    ____________________________________________   PROCEDURES  Procedure(s) performed (including Critical Care):  .1-3 Lead EKG Interpretation Performed by: Naaman Plummer, MD Authorized by: Naaman Plummer, MD     Interpretation: normal     ECG rate:  66   ECG rate assessment: normal     Rhythm: sinus rhythm     Ectopy: none     Conduction: normal     ____________________________________________   INITIAL  IMPRESSION / ASSESSMENT AND PLAN / ED COURSE  As part of my medical decision making, I reviewed the following data within the electronic medical record, if available:  Nursing notes reviewed and incorporated, Labs reviewed, EKG interpreted, Old chart reviewed, Radiograph reviewed and Notes from prior ED visits reviewed and incorporated      Patient presents for thoracic/scapular back pain. Given History and Exam the patient appears to be at low risk for Spinal Cord Compression Syndrome, Vertebral Malignancy/Mets, acute Spinal Fracture, Vertebral Osteomyelitis, Epidural Abscess, Infected or pulmonary embolism.  Their presentation appears most likely to be secondary to  non-emergent musculoskeletal etiology vs non-emergent disc herniation.  ED Workup: Defer imaging and labwork for outpatient follow up at this time.  Disposition: Discharge. Strict return precautions discussed with patient with full understanding. Advised patient to follow up promptly with primary care provider      ____________________________________________   FINAL CLINICAL IMPRESSION(S) / ED DIAGNOSES  Final diagnoses:  Acute pain of right shoulder  Pain of right scapula  Acute right-sided thoracic back pain     ED Discharge Orders     None        Note:  This document was prepared using Dragon voice recognition software and may include unintentional dictation errors.    Naaman Plummer, MD 01/26/21 [phone]

## 2021-01-27 NOTE — Progress Notes (Deleted)
Virtual Visit via Telephone Note  I connected with Rachel Best on 03/25/22 at  8:30 AM EST by telephone and verified that I am speaking with the correct person using two identifiers.  Location: Patient: Home Provider: Office    I discussed the limitations, risks, security and privacy concerns of performing an evaluation and management service by telephone and the availability of in person appointments. I also discussed with the patient that there may be a patient responsible charge related to this service. The patient expressed understanding and agreed to proceed.   Shared Decision Making Visit Lung Cancer Screening Program (G0296)   Eligibility: Age 75 y.o. Pack Years Smoking History Calculation 47 (# packs/per year x # years smoked) Recent History of coughing up blood  no Unexplained weight loss? no ( >Than 15 pounds within the last 6 months ) Prior History Lung / other cancer no (Diagnosis within the last 5 years already requiring surveillance chest CT Scans). Smoking Status Former Smoker Former Smokers: Years since quit: 10 years  Quit Date: 2014  Visit Components: Discussion included one or more decision making aids. yes Discussion included risk/benefits of screening. yes Discussion included potential follow up diagnostic testing for abnormal scans. yes Discussion included meaning and risk of over diagnosis. yes Discussion included meaning and risk of False Positives. yes Discussion included meaning of total radiation exposure. yes  Counseling Included: Importance of adherence to annual lung cancer LDCT screening. yes Impact of comorbidities on ability to participate in the program. yes Ability and willingness to under diagnostic treatment. yes  Smoking Cessation Counseling: Current Smokers:  Discussed importance of smoking cessation. yes Information about tobacco cessation classes and interventions provided to patient. yes Patient provided with "ticket" for LDCT  Scan. NA Symptomatic Patient. no  Counseling(Intermediate counseling: > three minutes) 99406 Diagnosis Code: Tobacco Use Z72.0 Asymptomatic Patient yes  Counseling (Intermediate counseling: > three minutes counseling) G0436 Former Smokers:  Discussed the importance of maintaining cigarette abstinence. yes Diagnosis Code: Personal History of Nicotine Dependence. Z87.891 Information about tobacco cessation classes and interventions provided to patient. Yes Patient provided with "ticket" for LDCT Scan. NA Written Order for Lung Cancer Screening with LDCT placed in Epic. Yes (CT Chest Lung Cancer Screening Low Dose W/O CM) [id number] Z12.2-Screening of respiratory organs Z87.891-Personal history of nicotine dependence  I have spent 25 minutes of face to face/ virtual visit   time with Rachel Best discussing the risks and benefits of lung cancer screening. We viewed / discussed a power point together that explained in detail the above noted topics. We paused at intervals to allow for questions to be asked and answered to ensure understanding.We discussed that the single most powerful action that he can take to decrease his risk of developing lung cancer is to quit smoking. We discussed whether or not he is ready to commit to setting a quit date. We discussed options for tools to aid in quitting smoking including nicotine replacement therapy, non-nicotine medications, support groups, Quit Smart classes, and behavior modification. We discussed that often times setting smaller, more achievable goals, such as eliminating 1 cigarette a day for a week and then 2 cigarettes a day for a week can be helpful in slowly decreasing the number of cigarettes smoked. This allows for a sense of accomplishment as well as providing a clinical benefit. I provided  him  with smoking cessation  information  with contact information for community resources, classes, free nicotine replacement therapy, and access to mobile apps, text    messaging, and on-line smoking cessation help. I have also provided  him  the office contact information in the event he needs to contact me, or the screening staff. We discussed the time and location of the scan, and that either Denise Phelps RN, Denise Buckner, RN  or I will call / send a letter with the results within 24-72 hours of receiving them. The patient verbalized understanding of all of  the above and had no further questions upon leaving the office. They have my contact information in the event they have any further questions.  I spent 3-5 minutes counseling on smoking cessation and the health risks of continued tobacco abuse.  I explained to the patient that there has been a high incidence of coronary artery disease noted on these exams. I explained that this is a non-gated exam therefore degree or severity cannot be determined. This patient is on statin therapy. I have asked the patient to follow-up with their PCP regarding any incidental finding of coronary artery disease and management with diet or medication as their PCP  feels is clinically indicated. The patient verbalized understanding of the above and had no further questions upon completion of the visit.    Rachel Sadiq W Jaquetta Currier, NP       

## 2021-01-27 NOTE — Patient Instructions (Signed)
Thank you for participating in the [facility] Scre[facility]. °It was our pleasure to meet you today. °We will call you with the results of your scan within the next few days. °Your scan will be assigned a Lung RADS category score by the physicians reading the scans.  °This Lung RADS score determines follow up scanning.  °See below for description of categories, and follow up screening recommendations. °We will be in touch to schedule your follow up screening annually or based on recommendations of our providers. °We will fax a copy of your scan results to your Primary Care Physician, or the physician who referred you to the program, to ensure they have the results. °Please call the office if you have any questions or concerns regarding your scanning experience or results.  °Our office number is [phone]. °Please speak with Denise Phelps, RN. She is our Lung Cancer Screening RN. °If she is unavailable when you call, please have the office staff send her a message. She will return your call at her earliest convenience. °Remember, if your scan is normal, we will scan you annually as long as you continue to meet the criteria for the program. (Age 55-77, Current smoker or smoker who has quit within the last 15 years). °If you are a smoker, remember, quitting is the single most powerful action that you can take to decrease your risk of lung cancer and other pulmonary, breathing related problems. °We know quitting is hard, and we are here to help.  °Please let us know if there is anything we can do to help you meet your goal of quitting. °If you are a former smoker, congratulations. We are proud of you! Remain smoke free! °Remember you can refer friends or family members through the number above.  °We will screen them to make sure they meet criteria for the program. °Thank you for helping us take better care of you by participating in Lung Screening. ° °You can receive free nicotine replacement therapy  ( patches, gum or mints) by calling 1-800-QUIT NOW. Please call so we can get you on the path to becoming  a non-smoker. I know it is hard, but you can do this! ° °Lung RADS Categories: ° °Lung RADS 1: no nodules or definitely non-concerning nodules.  °Recommendation is for a repeat annual scan in 12 months. ° °Lung RADS 2:  nodules that are non-concerning in appearance and behavior with a very low likelihood of becoming an active cancer. °Recommendation is for a repeat annual scan in 12 months. ° °Lung RADS 3: nodules that are probably non-concerning , includes nodules with a low likelihood of becoming an active cancer.  Recommendation is for a 6-month repeat screening scan. Often noted after an upper respiratory illness. We will be in touch to make sure you have no questions, and to schedule your 6-month scan. ° °Lung RADS 4 A: nodules with concerning findings, recommendation is most often for a follow up scan in 3 months or additional testing based on our provider's assessment of the scan. We will be in touch to make sure you have no questions and to schedule the recommended 3 month follow up scan. ° °Lung RADS 4 B:  indicates findings that are concerning. We will be in touch with you to schedule additional diagnostic testing based on our provider's  assessment of the scan. ° °Hypnosis for smoking cessation  °[employer] °[phone] ° °Acupuncture for smoking cessation  °[facility] °[phone]  °

## 2021-01-28 ENCOUNTER — Ambulatory Visit: Payer: Medicare HMO

## 2021-01-28 ENCOUNTER — Other Ambulatory Visit: Payer: Self-pay

## 2021-01-28 ENCOUNTER — Telehealth: Payer: Self-pay | Admitting: Primary Care

## 2021-01-28 ENCOUNTER — Encounter: Payer: Medicare HMO | Admitting: Primary Care

## 2021-01-28 DIAGNOSIS — Z87891 Personal history of nicotine dependence: Secondary | ICD-10-CM

## 2021-01-28 DIAGNOSIS — F17210 Nicotine dependence, cigarettes, uncomplicated: Secondary | ICD-10-CM

## 2021-01-28 NOTE — Telephone Encounter (Signed)
Patient was scheduled for shared decision making visit today for lung cancer screening program, she recently had CTA earlier this month that showed clear lungs, no mention of any lung nodules. We will reach out to our radiology department to make notation about lung nodules. We will cancel her LDCT today, likely can repeat CT imaging in 1 year. We will contact her if anything changes regarding that plan.

## 2021-01-29 NOTE — Telephone Encounter (Signed)
Lung screening CT has been cancelled. Order placed for f/u lung screening scan for 01/2022.

## 2021-02-06 ENCOUNTER — Other Ambulatory Visit: Payer: Self-pay

## 2021-02-06 ENCOUNTER — Ambulatory Visit
Admission: EM | Admit: 2021-02-06 | Discharge: 2021-02-06 | Disposition: A | Discharge status: Home / Self Care | Payer: Medicare HMO | Attending: Emergency Medicine | Admitting: Emergency Medicine

## 2021-02-06 DIAGNOSIS — B0229 Other postherpetic nervous system involvement: Secondary | ICD-10-CM | POA: Diagnosis not present

## 2021-02-06 MED ORDER — GABAPENTIN 300 MG PO CAPS: 300.0000 mg | ORAL_CAPSULE | Freq: Three times a day (TID) | ORAL | 1 refills | Status: DC

## 2021-02-06 NOTE — ED Triage Notes (Signed)
Pt presents with pain in her back going into right side. Reports she went to the ER and had CT scans done and everything was normal. Pt is still having pain. Pt does endorses feeling bumps that are sore on her right flank. This rn does not see any areas on assessment.

## 2021-02-06 NOTE — ED Provider Notes (Signed)
[facility]    CSN: [id number] Arrival date & time: 02/06/21  1000      History   Chief Complaint Chief Complaint  Patient presents with   Back Pain    HPI Rachel Best is a 75 y.o. female.   HPI  30 old female here for evaluation of right side pain.  Patient reports that she developed pain in her right side that rupturing from her back to underneath her right breast 2 weeks ago.  She describes this as a pain in the somebody is pressing on her ribs.  She states that nothing makes it better or or worse.  Is not been associate with fever, cough, or shortness of breath.  She was initially evaluated in the emergency department when her symptoms started and had a negative cardiac evaluation and CT scan of her chest.  She reports that she noticed some bumps on her back yesterday.  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   Hyperlipidemia 03/12/2016   Tobacco use disorder 03/12/2016   Atherosclerosis of native arteries of extremity with intermittent claudication ([place]) 03/12/2016   Cataract of both eyes 10/16/2015   Essential hypertension 10/16/2015   Glaucoma suspect of both eyes 10/16/2015   Hypercholesterolemia 10/16/2015   History of colonic polyps 12/30/2014   Glaucoma 12/06/2013    Past Surgical History:  Procedure Laterality Date   ABDOMINAL HYSTERECTOMY     CHOLECYSTECTOMY     COLONOSCOPY WITH PROPOFOL N/A 12/19/2014   Procedure: COLONOSCOPY WITH PROPOFOL;  Surgeon: Manya Silvas, MD;  Location: [facility];  Service: Endoscopy;  Laterality: N/A;   LOWER EXTREMITY ANGIOGRAPHY Left 11/29/2016   Procedure: Lower Extremity Angiography;   Surgeon: Algernon Huxley, MD;  Location: [facility];  Service: Cardiovascular;  Laterality: Left;    OB History   No obstetric history on file.      Home Medications    Prior to Admission medications   Medication Sig Start Date End Date Taking? Authorizing Provider  gabapentin (NEURONTIN) 300 MG capsule Take 1 capsule (300 mg total) by mouth 3 (three) times daily. 02/06/21 03/08/21 Yes Margarette Canada, NP  acetaminophen (TYLENOL) 650 MG CR tablet Take 1,300 mg by mouth every 8 (eight) hours as needed for pain.    [provider]  Ascorbic Acid (VITAMIN C) 1000 MG tablet Take 1,000 mg by mouth daily. Patient not taking: No sig reported    [provider]  Calcium Carb-Cholecalciferol (CALCIUM 600+D3 PO) Take 1 tablet by mouth once a week.    [provider]  clopidogrel (PLAVIX) 75 MG tablet Take 1 tablet (75 mg total) by mouth daily. 06/25/19   Kris Hartmann, NP  losartan (COZAAR) 25 MG tablet Take 1 tablet by mouth daily. 06/18/20 06/18/21  [provider]  Omega-3 Fatty Acids (FISH OIL) 1000 MG CAPS Take 2,000 mg by mouth 2 (two) times a week. Patient not taking: No sig reported    [provider]  simvastatin (ZOCOR) 40 MG tablet Take 40 mg by mouth at bedtime. 11/08/16   [provider]    Family History Family History  Problem Relation Age of Onset   Diabetes Mother  Hypertension Mother    Breast cancer Mother 47   Varicose Veins Maternal Grandmother    Breast cancer Maternal Aunt    Breast cancer Cousin        mat cousin    Social History Social History   Tobacco Use   Smoking status: Every Day    Types: Cigarettes    Last attempt to quit: 10/30/2016    Years since quitting: 4.2   Smokeless tobacco: Never  Vaping Use   Vaping Use: Never used  Substance Use Topics   Alcohol use: No   Drug use: No     Allergies   Penicillins and Aspirin   Review of Systems Review of Systems  Constitutional:   Negative for activity change, appetite change and fever.  Respiratory:  Negative for cough and shortness of breath.   Cardiovascular:  Negative for chest pain, palpitations and leg swelling.  Genitourinary:  Positive for flank pain.  Musculoskeletal:  Positive for back pain.  Skin:  Positive for rash.  Hematological: Negative.   Psychiatric/Behavioral: Negative.      Physical Exam Triage Vital Signs ED Triage Vitals  Enc Vitals Group     BP 02/06/21 1033 (!) 158/106     Pulse Rate 02/06/21 1033 86     Resp 02/06/21 1033 19     Temp 02/06/21 1033 98.4 F (36.9 C)     Temp src --      SpO2 02/06/21 1033 96 %     Weight --      Height --      Head Circumference --      Peak Flow --      Pain Score 02/06/21 1032 6     Pain Loc --      Pain Edu? --      Excl. in [place]? --    No data found.  Updated Vital Signs BP (!) 158/106    Pulse 86    Temp 98.4 F (36.9 C)    Resp 19    SpO2 96%   Visual Acuity Right Eye Distance:   Left Eye Distance:   Bilateral Distance:    Right Eye Near:   Left Eye Near:    Bilateral Near:     Physical Exam Vitals and nursing note reviewed.  Constitutional:      Appearance: Normal appearance. She is obese.  Cardiovascular:     Rate and Rhythm: Normal rate and regular rhythm.     Pulses: Normal pulses.     Heart sounds: Normal heart sounds. No murmur heard.   No friction rub. No gallop.  Pulmonary:     Effort: Pulmonary effort is normal.     Breath sounds: Normal breath sounds. No wheezing, rhonchi or rales.  Skin:    General: Skin is warm and dry.     Capillary Refill: Capillary refill takes less than 2 seconds.     Findings: Rash present.  Neurological:     General: No focal deficit present.     Mental Status: She is alert and oriented to person, place, and time.  Psychiatric:        Mood and Affect: Mood normal.        Behavior: Behavior normal.        Thought Content: Thought content normal.        Judgment: Judgment normal.      UC Treatments / Results  Labs (all labs ordered are listed, but only abnormal results are displayed) Labs Reviewed -  No data to display  EKG   Radiology No results found.  Procedures Procedures (including critical care time)  Medications Ordered in UC Medications - No data to display  Initial Impression / Assessment and Plan / UC Course  I have reviewed the triage vital signs and the nursing notes.  Pertinent labs & imaging results that were available during my care of the patient were reviewed by me and considered in my medical decision making (see chart for details).  Is a very pleasant, nontoxic-appearing 74 year old female here for evaluation of right-sided back pain that wraps around her rib cage and underneath her right breast that is been present for last 2 weeks.  She also noticed some bumps on her skin in line with the pain yesterday.  She was initially evaluated in the ER and had a negative cardiac and PE work-up.  Patient describes the pain is constant and is of somebody is pressing on her ribs.  She denies any chest pain, palpitations, leg swelling, shortness of breath, cough, or fever.  Patient physical exam reveals benign cardiopulmonary exam with S1-S2 heart sounds that are free of murmur, rub, or gallop.  Lung sounds are clear to auscultation all fields.  Patient inspection of the skin reveals 3 mildly erythematous and raised lesions in a row following a dermatome along the pathway of the fourth rib.  When tracing along this line and pressing on the patient's rib cage she states that that increases her pain.  There is no vesicles present given the duration of her symptoms I believe this is resolving shingles.  I believe the patient's pain is postherpetic neuralgia.  I will start her on gabapentin 300 mg at night and have given her directions on how she can increase it over the next 3 to 4 weeks if it does not prove her pain.  I have encouraged her to follow-up with her  primary care provider for reevaluation to determine if dosage adjustments need to be made or if new medication needs to be prescribed.  Patient verbalizes understanding of same.   Final Clinical Impressions(s) / UC Diagnoses   Final diagnoses:  Postherpetic neuralgia     Discharge Instructions      Take the gabapentin at bedtime for 1 week. If you are tolerating this dose you can increase to twice daily if it is not helping your pain. You can increase to 3 times a day after 3-4 weeks if you have not had any improvement in your pain.  Follow-up with your PCP for re-evaluation and medication management.      ED Prescriptions     Medication Sig Dispense Auth. Provider   gabapentin (NEURONTIN) 300 MG capsule Take 1 capsule (300 mg total) by mouth 3 (three) times daily. 90 capsule Margarette Canada, NP      PDMP not reviewed this encounter.   Margarette Canada, NP 02/06/21 [phone]

## 2021-02-06 NOTE — Discharge Instructions (Signed)
Take the gabapentin at bedtime for 1 week. If you are tolerating this dose you can increase to twice daily if it is not helping your pain. You can increase to 3 times a day after 3-4 weeks if you have not had any improvement in your pain.  Follow-up with your PCP for re-evaluation and medication management.

## 2021-03-16 ENCOUNTER — Encounter: Payer: Self-pay | Admitting: *Deleted

## 2021-03-17 ENCOUNTER — Ambulatory Visit: Payer: Medicare HMO | Admitting: Certified Registered Nurse Anesthetist

## 2021-03-17 ENCOUNTER — Encounter
Admission: RE | Disposition: A | Discharge status: Home / Self Care | Payer: Self-pay | Source: Home / Self Care | Attending: Gastroenterology

## 2021-03-17 ENCOUNTER — Ambulatory Visit
Admission: RE | Admit: 2021-03-17 | Discharge: 2021-03-17 | Disposition: A | Discharge status: Home / Self Care | Payer: Medicare HMO | Attending: Gastroenterology | Admitting: Gastroenterology

## 2021-03-17 DIAGNOSIS — Z01818 Encounter for other preprocedural examination: Secondary | ICD-10-CM | POA: Insufficient documentation

## 2021-03-17 DIAGNOSIS — Z538 Procedure and treatment not carried out for other reasons: Secondary | ICD-10-CM | POA: Diagnosis not present

## 2021-03-17 SURGERY — COLONOSCOPY
Anesthesia: General

## 2021-03-17 MED ORDER — SODIUM CHLORIDE 0.9 % IV SOLN: INTRAVENOUS | Status: DC

## 2021-04-30 ENCOUNTER — Other Ambulatory Visit: Payer: Self-pay | Admitting: Family Medicine

## 2021-04-30 DIAGNOSIS — Z780 Asymptomatic menopausal state: Secondary | ICD-10-CM

## 2021-06-10 ENCOUNTER — Ambulatory Visit: Payer: Medicare HMO

## 2021-06-11 ENCOUNTER — Ambulatory Visit
Admission: RE | Admit: 2021-06-11 | Discharge: 2021-06-11 | Disposition: A | Discharge status: Home / Self Care | Payer: Medicare HMO | Source: Ambulatory Visit | Attending: Family Medicine | Admitting: Family Medicine

## 2021-06-11 DIAGNOSIS — Z780 Asymptomatic menopausal state: Secondary | ICD-10-CM | POA: Insufficient documentation

## 2021-06-11 DIAGNOSIS — Z1231 Encounter for screening mammogram for malignant neoplasm of breast: Secondary | ICD-10-CM | POA: Insufficient documentation

## 2021-09-24 ENCOUNTER — Encounter: Payer: Self-pay | Admitting: *Deleted

## 2021-09-25 ENCOUNTER — Ambulatory Visit
Admission: RE | Admit: 2021-09-25 | Discharge: 2021-09-25 | Disposition: A | Discharge status: Home / Self Care | Payer: Medicare HMO | Attending: Gastroenterology | Admitting: Gastroenterology

## 2021-09-25 ENCOUNTER — Ambulatory Visit: Payer: Medicare HMO | Admitting: Anesthesiology

## 2021-09-25 ENCOUNTER — Encounter
Admission: RE | Disposition: A | Discharge status: Home / Self Care | Payer: Self-pay | Source: Home / Self Care | Attending: Gastroenterology

## 2021-09-25 ENCOUNTER — Encounter: Payer: Self-pay | Admitting: *Deleted

## 2021-09-25 DIAGNOSIS — K635 Polyp of colon: Secondary | ICD-10-CM | POA: Diagnosis not present

## 2021-09-25 DIAGNOSIS — Z1211 Encounter for screening for malignant neoplasm of colon: Secondary | ICD-10-CM | POA: Insufficient documentation

## 2021-09-25 DIAGNOSIS — I10 Essential (primary) hypertension: Secondary | ICD-10-CM | POA: Diagnosis not present

## 2021-09-25 DIAGNOSIS — Z7902 Long term (current) use of antithrombotics/antiplatelets: Secondary | ICD-10-CM | POA: Diagnosis not present

## 2021-09-25 DIAGNOSIS — I739 Peripheral vascular disease, unspecified: Secondary | ICD-10-CM | POA: Insufficient documentation

## 2021-09-25 DIAGNOSIS — I2510 Atherosclerotic heart disease of native coronary artery without angina pectoris: Secondary | ICD-10-CM | POA: Diagnosis not present

## 2021-09-25 DIAGNOSIS — K640 First degree hemorrhoids: Secondary | ICD-10-CM | POA: Diagnosis not present

## 2021-09-25 DIAGNOSIS — Z8679 Personal history of other diseases of the circulatory system: Secondary | ICD-10-CM | POA: Insufficient documentation

## 2021-09-25 HISTORY — DX: Acute embolism and thrombosis of unspecified deep veins of left lower extremity: I82.402

## 2021-09-25 HISTORY — PX: COLONOSCOPY WITH PROPOFOL: SHX5780

## 2021-09-25 HISTORY — DX: Essential (primary) hypertension: I10

## 2021-09-25 SURGERY — COLONOSCOPY WITH PROPOFOL
Anesthesia: General

## 2021-09-25 MED ORDER — PROPOFOL 10 MG/ML IV BOLUS
INTRAVENOUS | Status: DC | PRN
  Administered 2021-09-25: 120 ug/kg/min via INTRAVENOUS
  Administered 2021-09-25: 90 mg via INTRAVENOUS

## 2021-09-25 MED ORDER — PROPOFOL 10 MG/ML IV BOLUS
INTRAVENOUS | Status: AC
  Filled 2021-09-25: qty 40

## 2021-09-25 MED ORDER — SODIUM CHLORIDE 0.9 % IV SOLN: INTRAVENOUS | Status: DC

## 2021-09-25 MED ORDER — LIDOCAINE HCL (PF) 2 % IJ SOLN
INTRAMUSCULAR | Status: AC
  Filled 2021-09-25: qty 5

## 2021-09-25 MED ORDER — LIDOCAINE HCL (CARDIAC) PF 100 MG/5ML IV SOSY
PREFILLED_SYRINGE | INTRAVENOUS | Status: DC | PRN
  Administered 2021-09-25: 100 mg via INTRAVENOUS

## 2021-09-25 MED ORDER — STERILE WATER FOR IRRIGATION IR SOLN
Status: DC | PRN
  Administered 2021-09-25 (×2): 60 mL

## 2021-09-25 NOTE — Anesthesia Postprocedure Evaluation (Signed)
Anesthesia Post Note  Patient: Rachel Best  Procedure(s) Performed: COLONOSCOPY WITH PROPOFOL  Patient location during evaluation: Endoscopy Anesthesia Type: General Level of consciousness: awake and alert Pain management: pain level controlled Vital Signs Assessment: post-procedure vital signs reviewed and stable Respiratory status: spontaneous breathing, nonlabored ventilation, respiratory function stable and patient connected to nasal cannula oxygen Cardiovascular status: blood pressure returned to baseline and stable Postop Assessment: no apparent nausea or vomiting Anesthetic complications: no   No notable events documented.   Last Vitals:  Vitals:   09/25/21 1300 09/25/21 1310  BP: 126/74 137/69  Pulse: 79   Resp: 16   Temp: (!) 36.1 C   SpO2: 100%     Last Pain:  Vitals:   09/25/21 1300  TempSrc: Temporal  PainSc: Asleep                 Precious Haws Clark Cuff

## 2021-09-25 NOTE — Transfer of Care (Signed)
Immediate Anesthesia Transfer of Care Note  Patient: Rachel Best  Procedure(s) Performed: COLONOSCOPY WITH PROPOFOL  Patient Location: PACU  Anesthesia Type:General  Level of Consciousness: awake  Airway & Oxygen Therapy: Patient Spontanous Breathing  Post-op Assessment: Report given to RN and Post -op Vital signs reviewed and stable  Post vital signs: Reviewed and stable  Last Vitals:  Vitals Value Taken Time  BP 126/74 09/25/21 1300  Temp 35.9 1300  Pulse 79 09/25/21 1300  Resp 17 09/25/21 1300  SpO2 100 % 09/25/21 1300  Vitals shown include unvalidated device data.  Last Pain:  Vitals:   09/25/21 1134  TempSrc: Temporal  PainSc: 0-No pain         Complications: No notable events documented.

## 2021-09-25 NOTE — Op Note (Signed)
[facility] Patient Name: Rachel Best Procedure Date: 09/25/2021 11:41 AM MRN: [id number] Account #: [id number] Date of Birth: 1945-08-05 Admit Type: Outpatient Age: 76 Room: [facility] ENDO ROOM 3 Gender: Female Note Status: Finalized Instrument Name: Jasper Riling [id number] Procedure:             Colonoscopy Indications:           Surveillance: Personal history of adenomatous polyps                         on last colonoscopy > 5 years ago Providers:             Andrey Farmer MD, MD Referring MD:          Salome Holmes (Referring MD) Medicines:             Monitored Anesthesia Care Complications:         No immediate complications. Procedure:             Pre-Anesthesia Assessment:                        - Prior to the procedure, a History and Physical was                         performed, and patient medications and allergies were                         reviewed. The patient is competent. The risks and                         benefits of the procedure and the sedation options and                         risks were discussed with the patient. All questions                         were answered and informed consent was obtained.                         Patient identification and proposed procedure were                         verified by the physician, the nurse, the                         anesthesiologist, the anesthetist and the technician                         in the endoscopy suite. Mental Status Examination:                         alert and oriented. Airway Examination: normal                         oropharyngeal airway and neck mobility. Respiratory                         Examination: clear to auscultation. CV Examination:  normal. Prophylactic Antibiotics: The patient does not                         require prophylactic antibiotics. Prior                         Anticoagulants: The patient has taken Plavix                          (clopidogrel), last dose was 10 days prior to                         procedure. ASA Grade Assessment: III - A patient with                         severe systemic disease. After reviewing the risks and                         benefits, the patient was deemed in satisfactory                         condition to undergo the procedure. The anesthesia                         plan was to use monitored anesthesia care (MAC).                         Immediately prior to administration of medications,                         the patient was re-assessed for adequacy to receive                         sedatives. The heart rate, respiratory rate, oxygen                         saturations, blood pressure, adequacy of pulmonary                         ventilation, and response to care were monitored                         throughout the procedure. The physical status of the                         patient was re-assessed after the procedure.                        After obtaining informed consent, the colonoscope was                         passed under direct vision. Throughout the procedure,                         the patient's blood pressure, pulse, and oxygen                         saturations were monitored continuously. The  Colonoscope was introduced through the anus and                         advanced to the the cecum, identified by appendiceal                         orifice and ileocecal valve. The colonoscopy was                         technically difficult and complex due to restricted                         mobility of the colon. Successful completion of the                         procedure was aided by withdrawing the scope and                         replacing with the pediatric colonoscope. The patient                         tolerated the procedure well. The quality of the bowel                         preparation was adequate to identify  polyps. Findings:      The perianal and digital rectal examinations were normal.      A few hyperplastic, non-bleeding polyps were found in the recto-sigmoid       colon. The polyps were small in size.      Internal hemorrhoids were found during retroflexion. The hemorrhoids       were Grade I (internal hemorrhoids that do not prolapse).      The exam was otherwise without abnormality on direct and retroflexion       views. Impression:            - A few small, non-bleeding polyps at the                         recto-sigmoid colon.                        - Internal hemorrhoids.                        - The examination was otherwise normal on direct and                         retroflexion views.                        - No specimens collected. Recommendation:        - Discharge patient to home.                        - Resume previous diet.                        - Continue present medications.                        -  Repeat colonoscopy is not recommended due to current                         age (62 years or older) for surveillance.                        - Return to referring physician as previously                         scheduled. Procedure Code(s):     --- Professional ---                        U5427, Colorectal cancer screening; colonoscopy on                         individual at high risk Diagnosis Code(s):     --- Professional ---                        Z86.010, Personal history of colonic polyps                        K63.5, Polyp of colon                        K64.0, First degree hemorrhoids CPT copyright 2019 American Medical Association. All rights reserved. The codes documented in this report are preliminary and upon coder review may  be revised to meet current compliance requirements. Andrey Farmer MD, MD 09/25/2021 1:00:42 PM Number of Addenda: 0 Note Initiated On: 09/25/2021 11:41 AM Scope Withdrawal Time: 0 hours 9 minutes 13 seconds  Total Procedure  Duration: 0 hours 21 minutes 50 seconds  Estimated Blood Loss:  Estimated blood loss: none.      [facility]

## 2021-09-25 NOTE — H&P (Signed)
Outpatient short stay form Pre-procedure 09/25/2021  Rachel Rubenstein, MD  Primary Physician: Sharyne Peach, MD  Reason for visit:  Surveillance colonoscopy  History of present illness:    76 y/o lady with history of CAD on plavix with last dose 10 days ago, hypertension, and history of small pericardial effusion here for surveillance colonoscopy. Last colonoscopy in 2016 with small TA. History of hysterectomy. No family history of GI malignancies.    Current Facility-Administered Medications:    0.9 %  sodium chloride infusion, , Intravenous, Continuous, Jamol Ginyard, Hilton Cork, MD, Last Rate: 20 mL/hr at 09/25/21 1143, New Bag at 09/25/21 1143  Medications Prior to Admission  Medication Sig Dispense Refill Last Dose   acetaminophen (TYLENOL) 650 MG CR tablet Take 1,300 mg by mouth every 8 (eight) hours as needed for pain.   09/24/2021   Ascorbic Acid (VITAMIN C) 1000 MG tablet Take 1,000 mg by mouth daily.   Past Week   Calcium Carb-Cholecalciferol (CALCIUM 600+D3 PO) Take 1 tablet by mouth once a week.   Past Week   clopidogrel (PLAVIX) 75 MG tablet Take 1 tablet (75 mg total) by mouth daily. 90 tablet 3 09/14/2021   gabapentin (NEURONTIN) 300 MG capsule Take 1 capsule (300 mg total) by mouth 3 (three) times daily. 90 capsule 1    losartan (COZAAR) 25 MG tablet Take 1 tablet by mouth daily.      Omega-3 Fatty Acids (FISH OIL) 1000 MG CAPS Take 2,000 mg by mouth 2 (two) times a week. (Patient not taking: No sig reported)      simvastatin (ZOCOR) 40 MG tablet Take 40 mg by mouth at bedtime.        Allergies  Allergen Reactions   Penicillins     Has patient had a PCN reaction causing immediate rash, facial/tongue/throat swelling, SOB or lightheadedness with hypotension: Yes Has patient had a PCN reaction causing severe rash involving mucus membranes or skin necrosis: No Has patient had a PCN reaction that required hospitalization: No Has patient had a PCN reaction occurring within  the last 10 years: No If all of the above answers are "NO", then may proceed with Cephalosporin use.    Aspirin Nausea And Vomiting     Past Medical History:  Diagnosis Date   Hyperlipidemia    Hypertension    Left leg DVT (HCC)    Restless legs syndrome (RLS)     Review of systems:  Otherwise negative.    Physical Exam  Gen: Alert, oriented. Appears stated age.  HEENT: PERRLA. Lungs: No respiratory distress CV: RRR Abd: soft, benign, no masses Ext: No edema    Planned procedures: Proceed with colonoscopy. The patient understands the nature of the planned procedure, indications, risks, alternatives and potential complications including but not limited to bleeding, infection, perforation, damage to internal organs and possible oversedation/side effects from anesthesia. The patient agrees and gives consent to proceed.  Please refer to procedure notes for findings, recommendations and patient disposition/instructions.     Rachel Rubenstein, MD [facility]

## 2021-09-25 NOTE — Interval H&P Note (Signed)
History and Physical Interval Note:  09/25/2021 12:28 PM  Rachel Best  has presented today for surgery, with the diagnosis of Hx of adenomatous polyps of colon.  The various methods of treatment have been discussed with the patient and family. After consideration of risks, benefits and other options for treatment, the patient has consented to  Procedure(s): COLONOSCOPY WITH PROPOFOL (N/A) as a surgical intervention.  The patient's history has been reviewed, patient examined, no change in status, stable for surgery.  I have reviewed the patient's chart and labs.  Questions were answered to the patient's satisfaction.     Lesly Rubenstein  Ok to proceed with colonoscopy

## 2021-09-25 NOTE — Anesthesia Preprocedure Evaluation (Signed)
Anesthesia Evaluation  Patient identified by MRN, date of birth, ID band Patient awake    Reviewed: Allergy & Precautions, NPO status , Patient's Chart, lab work & pertinent test results  History of Anesthesia Complications Negative for: history of anesthetic complications  Airway Mallampati: III  TM Distance: <3 FB Neck ROM: full    Dental  (+) Upper Dentures, Lower Dentures   Pulmonary shortness of breath and with exertion, COPD, Current Smoker,    Pulmonary exam normal        Cardiovascular Exercise Tolerance: Good hypertension, (-) angina+ Peripheral Vascular Disease  Normal cardiovascular exam     Neuro/Psych negative neurological ROS  negative psych ROS   GI/Hepatic negative GI ROS, Neg liver ROS, neg GERD  ,  Endo/Other  negative endocrine ROS  Renal/GU negative Renal ROS  negative genitourinary   Musculoskeletal   Abdominal   Peds  Hematology negative hematology ROS (+)   Anesthesia Other Findings Past Medical History: No date: Hyperlipidemia No date: Hypertension No date: Left leg DVT ([place]) No date: Restless legs syndrome (RLS)  Past Surgical History: No date: ABDOMINAL HYSTERECTOMY No date: CHOLECYSTECTOMY No date: COLONOSCOPY 12/19/2014: COLONOSCOPY WITH PROPOFOL; N/A     Comment:  Procedure: COLONOSCOPY WITH PROPOFOL;  Surgeon: Manya Silvas, MD;  Location: [facility] ENDOSCOPY;  Service:               Endoscopy;  Laterality: N/A; 11/29/2016: LOWER EXTREMITY ANGIOGRAPHY; Left     Comment:  Procedure: Lower Extremity Angiography;  Surgeon: Algernon Huxley, MD;  Location: [place] CV LAB;  Service:               Cardiovascular;  Laterality: Left;  BMI    Body Mass Index: 27.83 kg/m      Reproductive/Obstetrics (+) Breast feeding                              Anesthesia Physical Anesthesia Plan  ASA: 3  Anesthesia Plan: General    Post-op Pain Management:    Induction: Intravenous  PONV Risk Score and Plan: Propofol infusion and TIVA  Airway Management Planned: Natural Airway and Nasal Cannula  Additional Equipment:   Intra-op Plan:   Post-operative Plan:   Informed Consent: I have reviewed the patients History and Physical, chart, labs and discussed the procedure including the risks, benefits and alternatives for the proposed anesthesia with the patient or authorized representative who has indicated his/her understanding and acceptance.     Dental Advisory Given  Plan Discussed with: Anesthesiologist, CRNA and Surgeon  Anesthesia Plan Comments: (Patient consented for risks of anesthesia including but not limited to:  - adverse reactions to medications - risk of airway placement if required - damage to eyes, teeth, lips or other oral mucosa - nerve damage due to positioning  - sore throat or hoarseness - Damage to heart, brain, nerves, lungs, other parts of body or loss of life  Patient voiced understanding.)        Anesthesia Quick Evaluation

## 2021-10-01 ENCOUNTER — Other Ambulatory Visit (INDEPENDENT_AMBULATORY_CARE_PROVIDER_SITE_OTHER): Payer: Self-pay | Admitting: Vascular Surgery

## 2021-10-01 DIAGNOSIS — I70213 Atherosclerosis of native arteries of extremities with intermittent claudication, bilateral legs: Secondary | ICD-10-CM

## 2021-10-06 ENCOUNTER — Encounter (INDEPENDENT_AMBULATORY_CARE_PROVIDER_SITE_OTHER): Payer: Medicare HMO

## 2021-10-06 ENCOUNTER — Ambulatory Visit (INDEPENDENT_AMBULATORY_CARE_PROVIDER_SITE_OTHER): Payer: Medicare HMO | Admitting: Vascular Surgery

## 2021-10-30 ENCOUNTER — Other Ambulatory Visit: Payer: Self-pay | Admitting: Family Medicine

## 2021-10-30 DIAGNOSIS — F17210 Nicotine dependence, cigarettes, uncomplicated: Secondary | ICD-10-CM

## 2021-11-26 ENCOUNTER — Ambulatory Visit
Admission: EM | Admit: 2021-11-26 | Discharge: 2021-11-26 | Disposition: A | Discharge status: Home / Self Care | Payer: Medicare HMO | Attending: Physician Assistant | Admitting: Physician Assistant

## 2021-11-26 DIAGNOSIS — J441 Chronic obstructive pulmonary disease with (acute) exacerbation: Secondary | ICD-10-CM | POA: Diagnosis not present

## 2021-11-26 DIAGNOSIS — R0981 Nasal congestion: Secondary | ICD-10-CM | POA: Diagnosis not present

## 2021-11-26 DIAGNOSIS — R051 Acute cough: Secondary | ICD-10-CM | POA: Diagnosis not present

## 2021-11-26 MED ORDER — ALBUTEROL SULFATE HFA 108 (90 BASE) MCG/ACT IN AERS: 1.0000 | INHALATION_SPRAY | Freq: Four times a day (QID) | RESPIRATORY_TRACT | 0 refills | Status: AC | PRN

## 2021-11-26 MED ORDER — AZITHROMYCIN 250 MG PO TABS: 250.0000 mg | ORAL_TABLET | Freq: Every day | ORAL | 0 refills | Status: DC

## 2021-11-26 MED ORDER — PREDNISONE 20 MG PO TABS: 40.0000 mg | ORAL_TABLET | Freq: Every day | ORAL | 0 refills | Status: AC

## 2021-11-26 NOTE — ED Provider Notes (Signed)
[facility]    CSN: [id number] Arrival date & time: 11/26/21  1043      History   Chief Complaint Chief Complaint  Patient presents with   Cough   Nasal Congestion    HPI Rachel Best is a 76 y.o. female presenting for 1 week history of cough that is occasionally productive of yellowish mucus, nasal congestion, sinus pressure, and chest congestion.  Reports a bit of increased shortness of breath from baseline.  Patient does have history of COPD and says she does not have any inhalers to use.  She reports that her symptoms are typically mild.  Continues to smoke.  She has not had any associated fevers.  Denies any known COVID exposure.  Has been taking OTC meds for symptoms.  No other complaints.  HPI  Past Medical History:  Diagnosis Date   Hyperlipidemia    Hypertension    Left leg DVT ([place])    Restless legs syndrome (RLS)     Patient Active Problem List   Diagnosis Date Noted   Status post laparoscopic cholecystectomy 10/02/2019   Tobacco dependence 10/02/2019   Spondylosis without myelopathy or radiculopathy, lumbar region 11/19/2018   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   Hyperlipidemia 03/12/2016   Tobacco use disorder 03/12/2016   Atherosclerosis of native arteries of extremity with intermittent claudication ([place]) 03/12/2016   Cataract of both eyes 10/16/2015   Essential hypertension 10/16/2015   Glaucoma suspect of both eyes 10/16/2015   Hypercholesterolemia 10/16/2015   History of colonic polyps 12/30/2014   Glaucoma 12/06/2013    Past Surgical History:  Procedure Laterality Date   ABDOMINAL HYSTERECTOMY     CHOLECYSTECTOMY     COLONOSCOPY     COLONOSCOPY WITH PROPOFOL N/A 12/19/2014   Procedure: COLONOSCOPY WITH PROPOFOL;  Surgeon: Manya Silvas, MD;  Location: [place];  Service: Endoscopy;  Laterality: N/A;   COLONOSCOPY WITH PROPOFOL N/A 09/25/2021   Procedure: COLONOSCOPY WITH PROPOFOL;  Surgeon: Lesly Rubenstein, MD;   Location: [facility] ENDOSCOPY;  Service: Endoscopy;  Laterality: N/A;   LOWER EXTREMITY ANGIOGRAPHY Left 11/29/2016   Procedure: Lower Extremity Angiography;  Surgeon: Algernon Huxley, MD;  Location: [place] [facility];  Service: Cardiovascular;  Laterality: Left;    OB History   No obstetric history on file.      Home Medications    Prior to Admission medications   Medication Sig Start Date End Date Taking? Authorizing Provider  albuterol (VENTOLIN HFA) 108 (90 Base) MCG/ACT inhaler Inhale 1-2 puffs into the lungs every 6 (six) hours as needed for wheezing or shortness of breath. 11/26/21  Yes Laurene Footman B, PA-C  azithromycin (ZITHROMAX) 250 MG tablet Take 1 tablet (250 mg total) by mouth daily. Take first 2 tablets together, then 1 every day until finished. 11/26/21  Yes Danton Clap, PA-C  predniSONE (DELTASONE) 20 MG tablet Take 2 tablets (40 mg total) by mouth daily for 5 days. 11/26/21 12/01/21 Yes Danton Clap, PA-C  acetaminophen (TYLENOL) 650 MG CR tablet Take 1,300 mg by mouth every 8 (eight) hours as needed for pain.    [provider]  Ascorbic Acid (VITAMIN C) 1000 MG tablet Take 1,000 mg by mouth daily.    [provider]  Calcium Carb-Cholecalciferol (CALCIUM 600+D3 PO) Take 1 tablet by mouth once a week.    [provider]  clopidogrel (PLAVIX) 75 MG tablet Take 1 tablet (75 mg total) by mouth daily. 06/25/19   Kris Hartmann,  NP  gabapentin (NEURONTIN) 300 MG capsule Take 1 capsule (300 mg total) by mouth 3 (three) times daily. 02/06/21 03/08/21  Margarette Canada, NP  losartan (COZAAR) 25 MG tablet Take 1 tablet by mouth daily. 06/18/20 06/18/21  [provider]  Omega-3 Fatty Acids (FISH OIL) 1000 MG CAPS Take 2,000 mg by mouth 2 (two) times a week. Patient not taking: No sig reported    [provider]  simvastatin (ZOCOR) 40 MG tablet Take 40 mg by mouth at bedtime. 11/08/16   [provider]    Family History Family  History  Problem Relation Age of Onset   Diabetes Mother    Hypertension Mother    Breast cancer Mother 76   Varicose Veins Maternal Grandmother    Breast cancer Maternal Aunt    Breast cancer Cousin        mat cousin    Social History Social History   Tobacco Use   Smoking status: Every Day    Packs/day: 0.50    Types: Cigarettes   Smokeless tobacco: Never  Vaping Use   Vaping Use: Never used  Substance Use Topics   Alcohol use: No   Drug use: No     Allergies   Penicillins and Aspirin   Review of Systems Review of Systems  Constitutional:  Positive for fatigue. Negative for chills, diaphoresis and fever.  HENT:  Positive for congestion, rhinorrhea and sinus pressure. Negative for ear pain, sinus pain and sore throat.   Respiratory:  Positive for cough and shortness of breath.   Cardiovascular:  Negative for chest pain.  Gastrointestinal:  Negative for abdominal pain, nausea and vomiting.  Musculoskeletal:  Negative for arthralgias and myalgias.  Skin:  Negative for rash.  Neurological:  Negative for weakness and headaches.  Hematological:  Negative for adenopathy.     Physical Exam Triage Vital Signs ED Triage Vitals  Enc Vitals Group     BP      Pulse      Resp      Temp      Temp src      SpO2      Weight      Height      Head Circumference      Peak Flow      Pain Score      Pain Loc      Pain Edu?      Excl. in [place]?    No data found.  Updated Vital Signs BP 134/76 (BP Location: Left Arm)   Pulse 87   Temp 99.3 F (37.4 C)   Ht '5\' 8"'$  (1.727 m)   Wt 178 lb (80.7 kg)   SpO2 96%   BMI 27.06 kg/m   Physical Exam Vitals and nursing note reviewed.  Constitutional:      General: She is not in acute distress.    Appearance: Normal appearance. She is not ill-appearing or toxic-appearing.  HENT:     Head: Normocephalic and atraumatic.     Nose: Congestion present.     Mouth/Throat:     Mouth: Mucous membranes are moist.     Pharynx:  Oropharynx is clear.  Eyes:     General: No scleral icterus.       Right eye: No discharge.        Left eye: No discharge.     Conjunctiva/sclera: Conjunctivae normal.  Cardiovascular:     Rate and Rhythm: Normal rate and regular rhythm.  Heart sounds: Normal heart sounds.  Pulmonary:     Effort: Pulmonary effort is normal. No respiratory distress.     Breath sounds: Rhonchi (few scattered rhonchi) present. No wheezing or rales.  Musculoskeletal:     Cervical back: Neck supple.  Skin:    General: Skin is dry.  Neurological:     General: No focal deficit present.     Mental Status: She is alert. Mental status is at baseline.     Motor: No weakness.     Gait: Gait normal.  Psychiatric:        Mood and Affect: Mood normal.        Behavior: Behavior normal.        Thought Content: Thought content normal.      UC Treatments / Results  Labs (all labs ordered are listed, but only abnormal results are displayed) Labs Reviewed - No data to display  EKG   Radiology No results found.  Procedures Procedures (including critical care time)  Medications Ordered in UC Medications - No data to display  Initial Impression / Assessment and Plan / UC Course  I have reviewed the triage vital signs and the nursing notes.  Pertinent labs & imaging results that were available during my care of the patient were reviewed by me and considered in my medical decision making (see chart for details).   76 year old patient with history of COPD and tobacco abuse presents for 1 week history of productive cough, congestion and increased shortness of breath from baseline.  Does not have any inhalers.  No associated fever.  No known COVID exposure.  Vitals are all normal and stable and she is overall well-appearing and in no acute distress.  On exam she has mild nasal congestion.  Throat is clear.  She has few scattered rhonchi of the upper airway which mostly clears with cough.  Advised patient she  is having mild COPD exacerbation.  We will treat at this time with azithromycin, prednisone and ProAir inhaler.  Encouraged use of Mucinex and plenty of fluids as well.  Advised to return if she has fever or worsening symptoms.   Final Clinical Impressions(s) / UC Diagnoses   Final diagnoses:  COPD exacerbation ([place])  Acute cough  Nasal congestion     Discharge Instructions      -I believe you are having an exacerbation of your COPD.  I have sent antibiotics, a corticosteroid and an inhaler to the pharmacy.  Also start taking Mucinex and increasing fluid intake. - If you develop fever or increased fatigue, sweats, chills, weakness or increased shortness of breath you should be seen again right away either here or the emergency department and have an x-ray to check you out for possible pneumonia.     ED Prescriptions     Medication Sig Dispense Auth. Provider   azithromycin (ZITHROMAX) 250 MG tablet Take 1 tablet (250 mg total) by mouth daily. Take first 2 tablets together, then 1 every day until finished. 6 tablet Laurene Footman B, PA-C   predniSONE (DELTASONE) 20 MG tablet Take 2 tablets (40 mg total) by mouth daily for 5 days. 10 tablet Laurene Footman B, PA-C   albuterol (VENTOLIN HFA) 108 (90 Base) MCG/ACT inhaler Inhale 1-2 puffs into the lungs every 6 (six) hours as needed for wheezing or shortness of breath. 1 g Danton Clap, PA-C      PDMP not reviewed this encounter.   Danton Clap, PA-C 11/26/21 1143

## 2021-11-26 NOTE — Discharge Instructions (Signed)
-  I believe you are having an exacerbation of your COPD.  I have sent antibiotics, a corticosteroid and an inhaler to the pharmacy.  Also start taking Mucinex and increasing fluid intake. - If you develop fever or increased fatigue, sweats, chills, weakness or increased shortness of breath you should be seen again right away either here or the emergency department and have an x-ray to check you out for possible pneumonia.

## 2021-11-26 NOTE — ED Triage Notes (Signed)
Patient c/o cough and chest congestion -- symptoms started about 7 days ago.   Patient denies any fevers.

## 2022-01-26 ENCOUNTER — Ambulatory Visit: Payer: Medicare HMO

## 2022-02-17 ENCOUNTER — Ambulatory Visit
Admission: EM | Admit: 2022-02-17 | Discharge: 2022-02-17 | Disposition: A | Discharge status: Home / Self Care | Payer: Medicare HMO | Attending: Family Medicine | Admitting: Family Medicine

## 2022-02-17 DIAGNOSIS — G629 Polyneuropathy, unspecified: Secondary | ICD-10-CM

## 2022-02-17 MED ORDER — GABAPENTIN 300 MG PO CAPS: 300.0000 mg | ORAL_CAPSULE | Freq: Three times a day (TID) | ORAL | 0 refills | Status: AC | PRN

## 2022-02-17 NOTE — ED Triage Notes (Signed)
Pt c/o possible shingles on the left shoulder blade x2days  Pt states that she has had shingles before on the right shoulder blade.   Pt has been taking tylenol for the pain.

## 2022-02-17 NOTE — ED Triage Notes (Signed)
Pt states that a rash has not formed as of yet.

## 2022-02-17 NOTE — Discharge Instructions (Addendum)
Stop by the pharmacy to pick up your prescriptions.  Follow up with your primary care provider (PCP), as scheduled.   If you start having a rash, return to the urgent care or follow up with your PCP.

## 2022-02-17 NOTE — ED Provider Notes (Signed)
[facility]    CSN: [id number] Arrival date & time: 02/17/22  0805      History   Chief Complaint Chief Complaint  Patient presents with   Back Pain    HPI  HPI Rachel Best is a 77 y.o. female.   Rachel Best presents for left shoulder pain that feels like a burning. There is no rash.  Has history of shingles. The area is very sensitive. She doesn't want anything touching it.  Pain started yesterday.  She took 2 Tylenol this morning.  Reports similar pain when she had shingles last year.  Has not seen any bumps or rashes.  Reports no fever.  States she was seen here previously and given a medication that helped her symptoms.  No recent heavy lifting, trauma or injury. No headache, lower back pain or headache.  There has not been any nausea or vomiting. No other pain.    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   Hyperlipidemia 03/12/2016   Tobacco use disorder 03/12/2016   Atherosclerosis of native arteries of extremity with intermittent claudication ([place]) 03/12/2016   Cataract of both eyes 10/16/2015   Essential hypertension 10/16/2015   Glaucoma suspect of both eyes 10/16/2015   Hypercholesterolemia 10/16/2015   History of colonic polyps 12/30/2014   Glaucoma 12/06/2013    Past Surgical History:  Procedure Laterality Date   ABDOMINAL HYSTERECTOMY     CHOLECYSTECTOMY     COLONOSCOPY     COLONOSCOPY WITH PROPOFOL N/A 12/19/2014   Procedure: COLONOSCOPY WITH PROPOFOL;  Surgeon: Manya Silvas, MD;  Location: [facility];  Service: Endoscopy;  Laterality: N/A;   COLONOSCOPY WITH PROPOFOL N/A 09/25/2021    Procedure: COLONOSCOPY WITH PROPOFOL;  Surgeon: Lesly Rubenstein, MD;  Location: [facility] END[facility]COPY;  Service: Endoscopy;  Laterality: N/A;   LOWER EXTREMITY ANGIOGRAPHY Left 11/29/2016   Procedure: Lower Extremity Angiography;  Surgeon: Algernon Huxley, MD;  Location: [facility];  Service: Cardiovascular;  Laterality: Left;    OB History   No obstetric history on file.      Home Medications    Prior to Admission medications   Medication Sig Start Date End Date Taking? Authorizing Provider  acetaminophen (TYLENOL) 650 MG CR tablet Take 1,300 mg by mouth every 8 (eight) hours as needed for pain.   Yes [provider]  albuterol (VENTOLIN HFA) 108 (90 Base) MCG/ACT inhaler Inhale 1-2 puffs into the lungs every 6 (six) hours as needed for wheezing or shortness of breath. 11/26/21  Yes Danton Clap, PA-C  Ascorbic Acid (VITAMIN C) 1000 MG tablet Take 1,000 mg by mouth daily.   Yes [provider]  Calcium Carb-Cholecalciferol (CALCIUM 600+D3 PO) Take 1 tablet by mouth once a week.   Yes [provider]  clopidogrel (PLAVIX) 75 MG tablet Take 1 tablet (75 mg total) by mouth daily. 06/25/19  Yes Kris Hartmann, NP  simvastatin (ZOCOR) 40 MG tablet Take 40 mg by mouth at bedtime. 11/08/16  Yes [provider]  azithromycin (ZITHROMAX) 250 MG tablet Take 1 tablet (250 mg total) by mouth daily. Take first 2 tablets  together, then 1 every day until finished. 11/26/21   Laurene Footman B, PA-C  gabapentin (NEURONTIN) 300 MG capsule Take 1 capsule (300 mg total) by mouth 3 (three) times daily as needed. 02/17/22 03/19/22  Lyndee Hensen, DO  losartan (COZAAR) 25 MG tablet Take 1 tablet by mouth daily. 06/18/20 06/18/21  [provider]  Omega-3 Fatty Acids (FISH OIL) 1000 MG CAPS Take 2,000 mg by mouth 2 (two) times a week. Patient not taking: Reported on 10/02/2019    [provider]    Family History Family History  Problem Relation Age of  Onset   Diabetes Mother    Hypertension Mother    Breast cancer Mother 69   Varicose Veins Maternal Grandmother    Breast cancer Maternal Aunt    Breast cancer Cousin        mat cousin    Social History Social History   Tobacco Use   Smoking status: Every Day    Packs/day: 0.50    Types: Cigarettes   Smokeless tobacco: Never  Vaping Use   Vaping Use: Never used  Substance Use Topics   Alcohol use: No   Drug use: No     Allergies   Penicillins and Aspirin   Review of Systems Review of Systems: egative unless otherwise stated in HPI.      Physical Exam Triage Vital Signs ED Triage Vitals  Enc Vitals Group     BP      Pulse      Resp      Temp      Temp src      SpO2      Weight      Height      Head Circumference      Peak Flow      Pain Score      Pain Loc      Pain Edu?      Excl. in [place]?    No data found.  Updated Vital Signs BP (!) 145/70 (BP Location: Left Arm)   Pulse 80   Temp 98.1 F (36.7 C) (Oral)   Resp 18   Ht '5\' 8"'$  (1.727 m)   Wt 79.8 kg   SpO2 95%   BMI 26.76 kg/m   Visual Acuity Right Eye Distance:   Left Eye Distance:   Bilateral Distance:    Right Eye Near:   Left Eye Near:    Bilateral Near:     Physical Exam GEN: well appearing female in no acute distress  CVS: well perfused  RESP: speaking in full sentences without pause, no respiratory distress  MSK:  Shoulder, left: No evidence of bony deformity, asymmetry, or muscle atrophy; No tenderness over long head of biceps (bicipital groove). No TTP at [facility] joint. Full active range of motion  Strength 5/5 throughout. No abnormal scapular function observed. Sensation intact. Peripheral pulses intact. Spine: - Inspection: no gross deformity or asymmetry, swelling or ecchymosis. No skin changes  - Palpation: No TTP over the spinous processes - ROM: full active ROM of the spine in rotation, flexion and extension  - Strength: UE and LE 5/5  - Neuro: sensation intact however  increased over T6-7 dermatome - Special testing: Negative straight leg raise SKIN: warm, dry, no overly skin rash or erythema    UC Treatments / Results  Labs (all labs ordered are listed, but only abnormal results are displayed) Labs Reviewed - No data to display  EKG   Radiology No results  found.  Procedures Procedures (including critical care time)  Medications Ordered in UC Medications - No data to display  Initial Impression / Assessment and Plan / UC Course  I have reviewed the triage vital signs and the nursing notes.  Pertinent labs & imaging results that were available during my care of the patient were reviewed by me and considered in my medical decision making (see chart for details).      Pt is a 77 y.o.  female with history of HLD, HTN, COPD and spondylosis of lumbar spine presents for 2-3 days of left upper back pain.  Imaging deferred. History and exam is concerning for a neuropathy vs early onset shingles. Doubt MSK origin. On chart review, she was given gabapentin previously for post-herpetic neuralgia that helped her pain in Dec 2022.  Treat with gabapentin 300 mg TID PRN. Serum Cr normal in Sep 2023.   Patient to gradually return to normal activities, as tolerated and continue ordinary activities within the limits permitted by pain. Tylenol and Lidocaine patches PRN for multimodal pain relief. Pt to return for antiviral therapy if rash appears.  Return and ED precautions given.   Discussed MDM, treatment plan and plan for follow-up with patient who agrees with plan.   Final Clinical Impressions(s) / UC Diagnoses   Final diagnoses:  Neuropathy     Discharge Instructions      Stop by the pharmacy to pick up your prescriptions.  Follow up with your primary care provider (PCP), as scheduled.   If you start having a rash, return to the urgent care or follow up with your PCP.      ED Prescriptions     Medication Sig Dispense Auth. Provider    gabapentin (NEURONTIN) 300 MG capsule Take 1 capsule (300 mg total) by mouth 3 (three) times daily as needed. 90 capsule Trashaun Streight, DO      PDMP not reviewed this encounter.   Lyndee Hensen, DO 02/17/22 1146

## 2022-03-29 ENCOUNTER — Encounter: Payer: Self-pay | Admitting: *Deleted

## 2022-04-26 ENCOUNTER — Telehealth (INDEPENDENT_AMBULATORY_CARE_PROVIDER_SITE_OTHER): Payer: Self-pay | Admitting: Vascular Surgery

## 2022-04-26 NOTE — Telephone Encounter (Signed)
It looks like she hasn't had ABIs in some time. She can come and see me or Dew

## 2022-04-26 NOTE — Telephone Encounter (Signed)
Pt states that for the last 3-4 months has had problems with legs. Left side giving out and pain not going away.

## 2022-04-27 NOTE — Telephone Encounter (Signed)
LVM for pt TCB and schedule appt  ABI + consult. See jd/fb

## 2022-05-06 ENCOUNTER — Other Ambulatory Visit: Payer: Self-pay | Admitting: Family Medicine

## 2022-05-06 DIAGNOSIS — Z1231 Encounter for screening mammogram for malignant neoplasm of breast: Secondary | ICD-10-CM

## 2022-06-04 ENCOUNTER — Encounter (INDEPENDENT_AMBULATORY_CARE_PROVIDER_SITE_OTHER): Payer: Self-pay | Admitting: Vascular Surgery

## 2022-06-04 ENCOUNTER — Ambulatory Visit (INDEPENDENT_AMBULATORY_CARE_PROVIDER_SITE_OTHER): Payer: Medicare HMO

## 2022-06-04 ENCOUNTER — Ambulatory Visit (INDEPENDENT_AMBULATORY_CARE_PROVIDER_SITE_OTHER): Payer: Medicare HMO | Admitting: Vascular Surgery

## 2022-06-04 VITALS — BP 125/73 | HR 77 | Resp 15 | Wt 178.2 lb

## 2022-06-04 DIAGNOSIS — I10 Essential (primary) hypertension: Secondary | ICD-10-CM | POA: Diagnosis not present

## 2022-06-04 DIAGNOSIS — E785 Hyperlipidemia, unspecified: Secondary | ICD-10-CM | POA: Diagnosis not present

## 2022-06-04 DIAGNOSIS — I70213 Atherosclerosis of native arteries of extremities with intermittent claudication, bilateral legs: Secondary | ICD-10-CM | POA: Diagnosis not present

## 2022-06-04 NOTE — Progress Notes (Signed)
MRN : [id number]  Rachel Best is a 77 y.o. (July 30, 1945) female who presents with chief complaint of  Chief Complaint  Patient presents with   Follow-up    Ultrasound follow up  .  History of Present Illness: Patient returns today in follow up of her PAD.  She continues to have some degree of claudication although this is not lifestyle limiting.  She also has had significant improvement in her symptoms after getting new shoes.  She denies any ulceration or infection.  She has undergone left lower extremity revascularization several years ago.  Her ABIs today are 0.70 on the right and 0.93 on the left with biphasic waveforms surprisingly strong normal digital pressures bilaterally.  Current Outpatient Medications  Medication Sig Dispense Refill   acetaminophen (TYLENOL) 650 MG CR tablet Take 1,300 mg by mouth every 8 (eight) hours as needed for pain.     albuterol (VENTOLIN HFA) 108 (90 Base) MCG/ACT inhaler Inhale 1-2 puffs into the lungs every 6 (six) hours as needed for wheezing or shortness of breath. 1 g 0   Ascorbic Acid (VITAMIN C) 1000 MG tablet Take 1,000 mg by mouth daily.     Calcium Carb-Cholecalciferol (CALCIUM 600+D3 PO) Take 1 tablet by mouth once a week.     clopidogrel (PLAVIX) 75 MG tablet Take 1 tablet (75 mg total) by mouth daily. 90 tablet 3   gabapentin (NEURONTIN) 300 MG capsule Take 1 capsule (300 mg total) by mouth 3 (three) times daily as needed. 90 capsule 0   losartan (COZAAR) 25 MG tablet Take 1 tablet by mouth daily.     simvastatin (ZOCOR) 40 MG tablet Take 40 mg by mouth at bedtime.     azithromycin (ZITHROMAX) 250 MG tablet Take 1 tablet (250 mg total) by mouth daily. Take first 2 tablets together, then 1 every day until finished. (Patient not taking: Reported on 06/04/2022) 6 tablet 0   Omega-3 Fatty Acids (FISH OIL) 1000 MG CAPS Take 2,000 mg by mouth 2 (two) times a week. (Patient not taking: Reported on 10/02/2019)     No current facility-administered  medications for this visit.    Past Medical History:  Diagnosis Date   Hyperlipidemia    Hypertension    Left leg DVT (HCC)    Restless legs syndrome (RLS)     Past Surgical History:  Procedure Laterality Date   ABDOMINAL HYSTERECTOMY     CHOLECYSTECTOMY     COLONOSCOPY     COLONOSCOPY WITH PROPOFOL N/A 12/19/2014   Procedure: COLONOSCOPY WITH PROPOFOL;  Surgeon: Scot Jun, MD;  Location: [facility] ENDOSCOPY;  Service: Endoscopy;  Laterality: N/A;   COLONOSCOPY WITH PROPOFOL N/A 09/25/2021   Procedure: COLONOSCOPY WITH PROPOFOL;  Surgeon: Regis Bill, MD;  Location: [facility] ENDOSCOPY;  Service: Endoscopy;  Laterality: N/A;   LOWER EXTREMITY ANGIOGRAPHY Left 11/29/2016   Procedure: Lower Extremity Angiography;  Surgeon: Annice Needy, MD;  Location: [facility];  Service: Cardiovascular;  Laterality: Left;     Social History   Tobacco Use   Smoking status: Every Day    Packs/day: .5    Types: Cigarettes   Smokeless tobacco: Never  Vaping Use   Vaping Use: Never used  Substance Use Topics   Alcohol use: No   Drug use: No       Family History  Problem Relation Age of Onset   Diabetes Mother    Hypertension Mother    Breast cancer Mother 8  Varicose Veins Maternal Grandmother    Breast cancer Maternal Aunt    Breast cancer Cousin        mat cousin     Allergies  Allergen Reactions   Penicillins     Has patient had a PCN reaction causing immediate rash, facial/tongue/throat swelling, SOB or lightheadedness with hypotension: Yes Has patient had a PCN reaction causing severe rash involving mucus membranes or skin necrosis: No Has patient had a PCN reaction that required hospitalization: No Has patient had a PCN reaction occurring within the last 10 years: No If all of the above answers are "NO", then may proceed with Cephalosporin use.    Aspirin Nausea And Vomiting     REVIEW OF SYSTEMS (Negative unless checked)   Constitutional: [] Weight  loss  [] Fever  [] Chills Cardiac: [] Chest pain   [] Chest pressure   [] Palpitations   [] Shortness of breath when laying flat   [] Shortness of breath at rest   [] Shortness of breath with exertion. Vascular:  [x] Pain in legs with walking   [] Pain in legs at rest   [] Pain in legs when laying flat   [x] Claudication   [] Pain in feet when walking  [] Pain in feet at rest  [] Pain in feet when laying flat   [] History of DVT   [] Phlebitis   [] Swelling in legs   [] Varicose veins   [] Non-healing ulcers Pulmonary:   [] Uses home oxygen   [] Productive cough   [] Hemoptysis   [] Wheeze  [] COPD   [] Asthma Neurologic:  [] Dizziness  [] Blackouts   [] Seizures   [] History of stroke   [] History of TIA  [] Aphasia   [] Temporary blindness   [] Dysphagia   [] Weakness or numbness in arms   [] Weakness or numbness in legs Musculoskeletal:  [x] Arthritis   [] Joint swelling   [x] Joint pain   [] Low back pain Hematologic:  [] Easy bruising  [] Easy bleeding   [] Hypercoagulable state   [] Anemic   Gastrointestinal:  [] Blood in stool   [] Vomiting blood  [x] Gastroesophageal reflux/heartburn   [] Abdominal pain Genitourinary:  [] Chronic kidney disease   [] Difficult urination  [] Frequent urination  [] Burning with urination   [] Hematuria Skin:  [] Rashes   [] Ulcers   [] Wounds Psychological:  [] History of anxiety   []  History of major depression.   Physical Examination  BP 125/73 (BP Location: Left Arm)   Pulse 77   Resp 15   Wt 178 lb 3.2 oz (80.8 kg)   BMI 27.10 kg/m  Gen:  WD/WN, NAD Head: [place]/AT, No temporalis wasting. Ear/Nose/Throat: Hearing grossly intact, nares w/o erythema or drainage Eyes: Conjunctiva clear. Sclera non-icteric Neck: Supple.  Trachea midline Pulmonary:  Good air movement, no use of accessory muscles.  Cardiac: RRR, no JVD Vascular:  Vessel Right Left  Radial Palpable Palpable                          PT 1+ palpable 1+ palpable  DP 1+ palpable 2+ palpable   Gastrointestinal: soft,  non-tender/non-distended. No guarding/reflex.  Musculoskeletal: M/S 5/5 throughout.  No deformity or atrophy.  Mild lower extremity edema. Neurologic: Sensation grossly intact in extremities.  Symmetrical.  Speech is fluent.  Psychiatric: Judgment intact, Mood & affect appropriate for pt's clinical situation. Dermatologic: No rashes or ulcers noted.  No cellulitis or open wounds.      Labs No results found for this or any previous visit (from the past 2160 hour(s)).  Radiology No results found.  Assessment/Plan Essential hypertension blood pressure control important  in reducing the progression of atherosclerotic disease. On appropriate oral medications.     Hyperlipidemia lipid control important in reducing the progression of atherosclerotic disease. Continue statin therapy  Atherosclerosis of native arteries of extremity with intermittent claudication (HCC) Her ABIs today are 0.70 on the right and 0.93 on the left with biphasic waveforms surprisingly strong normal digital pressures bilaterally.  Symptoms are stable and certainly not lifestyle limiting or limb threatening.  We will plan to follow on an annual basis.  Continue current medical regimen.    Festus Barren, MD  06/04/2022 2:37 PM    This note was created with Dragon medical transcription system.  Any errors from dictation are purely unintentional

## 2022-06-04 NOTE — Assessment & Plan Note (Signed)
Her ABIs today are 0.70 on the right and 0.93 on the left with biphasic waveforms surprisingly strong normal digital pressures bilaterally.  Symptoms are stable and certainly not lifestyle limiting or limb threatening.  We will plan to follow on an annual basis.  Continue current medical regimen.

## 2022-06-07 LAB — VAS US ABI WITH/WO TBI
Left ABI: 0.93
Right ABI: 0.7

## 2022-06-14 ENCOUNTER — Ambulatory Visit: Payer: Medicare HMO

## 2022-07-14 ENCOUNTER — Ambulatory Visit
Admission: EM | Admit: 2022-07-14 | Discharge: 2022-07-14 | Disposition: A | Discharge status: Home / Self Care | Payer: Medicare HMO | Attending: Family Medicine | Admitting: Family Medicine

## 2022-07-14 DIAGNOSIS — Z1152 Encounter for screening for COVID-19: Secondary | ICD-10-CM | POA: Insufficient documentation

## 2022-07-14 DIAGNOSIS — J069 Acute upper respiratory infection, unspecified: Secondary | ICD-10-CM | POA: Diagnosis present

## 2022-07-14 LAB — SARS CORONAVIRUS 2 BY RT PCR: SARS Coronavirus 2 by RT PCR: NEGATIVE

## 2022-07-14 MED ORDER — IPRATROPIUM BROMIDE 0.06 % NA SOLN: 2.0000 | Freq: Four times a day (QID) | NASAL | 12 refills | Status: AC

## 2022-07-14 MED ORDER — CETIRIZINE HCL 10 MG PO TABS: 10.0000 mg | ORAL_TABLET | Freq: Every day | ORAL | 11 refills | Status: AC | PRN

## 2022-07-14 NOTE — ED Triage Notes (Signed)
Pt c/o nasal congestion x3 days. No otc tx.

## 2022-07-14 NOTE — ED Provider Notes (Signed)
[facility]    CSN: [id number] Arrival date & time: 07/14/22  0849      History   Chief Complaint Chief Complaint  Patient presents with   Nasal Congestion    HPI Rachel Best is a 77 y.o. female.   HPI  History obtained from the patient. Rachel Best presents for rhinorrhea and nasal cognestion for the past 3 days.  Took Mucinex and Tylenol without relief.  No one else is sick.    Fever : no  Chills: no Sore throat: no   Cough: resolving Sputum: no Chest tightness: no Shortness of breath: no Wheezing: yes Nasal congestion: yes  Rhinorrhea: yes Myalgias: no Appetite: normal  Hydration: normal  Abdominal pain: no Nausea: no Vomiting: no Diarrhea: No Rash: No Sleep disturbance: yes Headache: no      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   Hyperlipidemia 03/12/2016   Tobacco use disorder 03/12/2016   Atherosclerosis of native arteries of extremity with intermittent claudication (HCC) 03/12/2016   Cataract of both eyes 10/16/2015   Essential hypertension 10/16/2015   Glaucoma suspect of both eyes 10/16/2015   Hypercholesterolemia 10/16/2015   History of colonic polyps 12/30/2014   Glaucoma 12/06/2013    Past Surgical History:  Procedure Laterality Date   ABDOMINAL HYSTERECTOMY     CHOLECYSTECTOMY     COLONOSCOPY     COLONOSCOPY WITH PROPOFOL N/A 12/19/2014   Procedure: COLONOSCOPY WITH PROPOFOL;  Surgeon: Scot Jun, MD;  Location: [facility] ENDOSCOPY;  Service: Endoscopy;  Laterality: N/A;   COLONOSCOPY WITH PROPOFOL N/A 09/25/2021   Procedure: COLONOSCOPY WITH PROPOFOL;  Surgeon: Regis Bill, MD;  Location: [facility] ENDOSCOPY;  Service: Endoscopy;  Laterality: N/A;   LOWER EXTREMITY ANGIOGRAPHY Left 11/29/2016   Procedure: Lower Extremity Angiography;  Surgeon: Annice Needy, MD;  Location: [facility] INVASIVE CV LAB;  Service: Cardiovascular;  Laterality: Left;    OB History   No obstetric history on file.      Home Medications    Prior to Admission medications   Medication Sig Start Date End Date Taking? Authorizing Provider  acetaminophen (TYLENOL) 650 MG CR tablet Take 1,300 mg by mouth every 8 (eight) hours as needed for pain.   Yes [provider]  albuterol (VENTOLIN HFA) 108 (90 Base) MCG/ACT inhaler Inhale 1-2 puffs into the lungs every 6 (six) hours as needed for wheezing or shortness of breath. 11/26/21  Yes Shirlee Latch, PA-C  Ascorbic Acid (VITAMIN C) 1000 MG tablet Take 1,000 mg by mouth daily.   Yes [provider]  Calcium Carb-Cholecalciferol (CALCIUM 600+D3 PO) Take 1 tablet by mouth once a week.   Yes [provider]  cetirizine (ZYRTEC) 10 MG tablet Take 1 tablet (10 mg total) by mouth daily as needed for allergies. 07/14/22  Yes Rowen Wilmer, Seward Meth, DO  clopidogrel (PLAVIX) 75 MG tablet Take 1 tablet (75 mg total) by mouth daily. 06/25/19  Yes Georgiana Spinner, NP  ipratropium (ATROVENT) 0.06 % nasal spray Place 2 sprays into both nostrils 4 (four) times daily. 07/14/22  Yes Velecia Ovitt, DO  simvastatin (ZOCOR) 40 MG tablet Take  40 mg by mouth at bedtime. 11/08/16  Yes [provider]  gabapentin (NEURONTIN) 300 MG capsule Take 1 capsule (300 mg total) by mouth 3 (three) times daily as needed. 02/17/22 06/04/22  Katha Cabal, DO  losartan (COZAAR) 25 MG tablet Take 1 tablet by mouth daily. 06/18/20 06/04/22  [provider]  Omega-3 Fatty Acids (FISH OIL) 1000 MG CAPS Take 2,000 mg by mouth 2 (two) times a week. Patient not taking: Reported on 10/02/2019    [provider]    Family History Family History   Problem Relation Age of Onset   Diabetes Mother    Hypertension Mother    Breast cancer Mother 31   Varicose Veins Maternal Grandmother    Breast cancer Maternal Aunt    Breast cancer Cousin        mat cousin    Social History Social History   Tobacco Use   Smoking status: Every Day    Packs/day: .5    Types: Cigarettes   Smokeless tobacco: Never  Vaping Use   Vaping Use: Never used  Substance Use Topics   Alcohol use: No   Drug use: No     Allergies   Penicillins and Aspirin   Review of Systems Review of Systems: negative unless otherwise stated in HPI.      Physical Exam Triage Vital Signs ED Triage Vitals  Enc Vitals Group     BP      Pulse      Resp      Temp      Temp src      SpO2      Weight      Height      Head Circumference      Peak Flow      Pain Score      Pain Loc      Pain Edu?      Excl. in GC?    No data found.  Updated Vital Signs BP 117/64   Pulse (!) 115   Temp 99.4 F (37.4 C) (Oral)   Resp 16   Ht 5\' 8"  (1.727 m)   Wt 67.1 kg   SpO2 97%   BMI 22.50 kg/m   Visual Acuity Right Eye Distance:   Left Eye Distance:   Bilateral Distance:    Right Eye Near:   Left Eye Near:    Bilateral Near:     Physical Exam GEN:     alert, non-toxic appearing female in no distress   HENT:  mucus membranes moist, oropharyngeal without lesions or erythema, no tonsillar hypertrophy or exudates,  moderate erythematous edematous turbinates, clear nasal discharge, bilateral TM normal EYES:   pupils equal and reactive, no scleral injection or discharge NECK:  good ROM, no meningismus   RESP:  no increased work of breathing, clear to auscultation bilaterally CVS:   regular rhythm, tachycardic  Skin:   warm and dry, no rash on visible skin    UC Treatments / Results  Labs (all labs ordered are listed, but only abnormal results are displayed) Labs Reviewed  SARS CORONAVIRUS 2 BY RT PCR    EKG   Radiology No results  found.  Procedures Procedures (including critical care time)  Medications Ordered in UC Medications - No data to display  Initial Impression / Assessment and Plan / UC Course  I have reviewed the triage vital signs and the nursing notes.  Pertinent labs & imaging results that were available during my  care of the patient were reviewed by me and considered in my medical decision making (see chart for details).       Pt is a 77 y.o. female who presents for 2-3 days of respiratory symptoms. Kimbree is tachycardic with elevated temperature here.  Satting well on room air. Overall pt is non-toxic appearing, well hydrated, without respiratory distress. Pulmonary exam is unremarkable.  COVID testing obtained. Pt to quarantine until COVID test results or longer if positive.  I will call patient with test results, if positive. History consistent with viral respiratory illness. Discussed symptomatic treatment.  Explained lack of efficacy of antibiotics in viral disease.  Typical duration of symptoms discussed. Start Zyrtec and Atrovent nasal spray.   COVID test is negative.   Return and ED precautions given and voiced understanding. Discussed MDM, treatment plan and plan for follow-up with patient who agrees with plan.     Final Clinical Impressions(s) / UC Diagnoses   Final diagnoses:  Upper respiratory tract infection, unspecified type     Discharge Instructions      We will contact you if your COVID test is positive.  Please quarantine while you wait for the results.  If your test is negative you may resume normal activities.  If your test is positive, quarantine until you are without a fever for at least 24 hours without fever-lowering (Tylenol/Motrin) medications.    If your were prescribed medication, stop by the pharmacy to pick them up.   You can take Tylenol and/or Ibuprofen as needed for fever reduction and pain relief.    For cough: honey 1/2 to 1 teaspoon (you can dilute the  honey in water or another fluid).  You can also use guaifenesin and dextromethorphan for cough. You can use a humidifier for chest congestion and cough.  If you don't have a humidifier, you can sit in the bathroom with the hot shower running.      For sore throat: try warm salt water gargles, Mucinex sore throat cough drops or cepacol lozenges, throat spray, warm tea or water with lemon/honey, popsicles or ice, or OTC cold relief medicine for throat discomfort. You can also purchase chloraseptic spray at the pharmacy or dollar store.   For congestion: take a daily anti-histamine like Zyrtec, Claritin, and a oral decongestant, such as pseudoephedrine.  You can also use Flonase 1-2 sprays in each nostril daily. Afrin is also a good option, if you do not have high blood pressure.    It is important to stay hydrated: drink plenty of fluids (water, gatorade/powerade/pedialyte, juices, or teas) to keep your throat moisturized and help further relieve irritation/discomfort.    Return or go to the Emergency Department if symptoms worsen or do not improve in the next few days      ED Prescriptions     Medication Sig Dispense Auth. Provider   ipratropium (ATROVENT) 0.06 % nasal spray Place 2 sprays into both nostrils 4 (four) times daily. 15 mL Delancey Moraes, DO   cetirizine (ZYRTEC) 10 MG tablet Take 1 tablet (10 mg total) by mouth daily as needed for allergies. 30 tablet Katha Cabal, DO      PDMP not reviewed this encounter.   Katha Cabal, DO 07/14/22 1236

## 2022-07-14 NOTE — Discharge Instructions (Signed)
We will contact you if your COVID test is positive.  Please quarantine while you wait for the results.  If your test is negative you may resume normal activities.  If your test is positive, quarantine until you are without a fever for at least 24 hours without fever-lowering (Tylenol/Motrin) medications.    If your were prescribed medication, stop by the pharmacy to pick them up.   You can take Tylenol and/or Ibuprofen as needed for fever reduction and pain relief.    For cough: honey 1/2 to 1 teaspoon (you can dilute the honey in water or another fluid).  You can also use guaifenesin and dextromethorphan for cough. You can use a humidifier for chest congestion and cough.  If you don't have a humidifier, you can sit in the bathroom with the hot shower running.      For sore throat: try warm salt water gargles, Mucinex sore throat cough drops or cepacol lozenges, throat spray, warm tea or water with lemon/honey, popsicles or ice, or OTC cold relief medicine for throat discomfort. You can also purchase chloraseptic spray at the pharmacy or dollar store.   For congestion: take a daily anti-histamine like Zyrtec, Claritin, and a oral decongestant, such as pseudoephedrine.  You can also use Flonase 1-2 sprays in each nostril daily. Afrin is also a good option, if you do not have high blood pressure.    It is important to stay hydrated: drink plenty of fluids (water, gatorade/powerade/pedialyte, juices, or teas) to keep your throat moisturized and help further relieve irritation/discomfort.    Return or go to the Emergency Department if symptoms worsen or do not improve in the next few days  

## 2022-08-10 ENCOUNTER — Ambulatory Visit
Admission: RE | Admit: 2022-08-10 | Discharge: 2022-08-10 | Disposition: A | Discharge status: Home / Self Care | Payer: Medicare HMO | Source: Ambulatory Visit | Attending: Family Medicine | Admitting: Family Medicine

## 2022-08-10 DIAGNOSIS — Z1231 Encounter for screening mammogram for malignant neoplasm of breast: Secondary | ICD-10-CM | POA: Diagnosis present

## 2022-11-08 ENCOUNTER — Other Ambulatory Visit: Payer: Self-pay

## 2022-11-08 DIAGNOSIS — F17210 Nicotine dependence, cigarettes, uncomplicated: Secondary | ICD-10-CM

## 2022-11-08 DIAGNOSIS — Z87891 Personal history of nicotine dependence: Secondary | ICD-10-CM

## 2022-11-08 DIAGNOSIS — Z122 Encounter for screening for malignant neoplasm of respiratory organs: Secondary | ICD-10-CM

## 2022-11-19 ENCOUNTER — Encounter: Payer: Self-pay | Admitting: Physician Assistant

## 2022-11-19 ENCOUNTER — Ambulatory Visit: Payer: Medicare HMO | Admitting: Physician Assistant

## 2022-11-19 DIAGNOSIS — F17210 Nicotine dependence, cigarettes, uncomplicated: Secondary | ICD-10-CM | POA: Diagnosis not present

## 2022-11-19 NOTE — Progress Notes (Signed)
Virtual Visit via Telephone Note  I connected with Karington Tramontana  on 11/19/22 at  1030 by telephone and verified that I am speaking with the correct person using two identifiers.  Location: Patient: home Provider: working virtually from home   I discussed the limitations, risks, security and privacy concerns of performing an evaluation and management service by telephone and the availability of in person appointments. I also discussed with the patient that there may be a patient responsible charge related to this service. The patient expressed understanding and agreed to proceed.     Shared Decision Making Visit Lung Cancer Screening Program [id number])   Eligibility: Age 40 Pack Years Smoking History Calculation 32 (# packs/per year x # years smoked) Recent History of coughing up blood  No Unexplained weight loss? No ( >Than 15 pounds within the last 6 months ) Prior History Lung / other cancer No (Diagnosis within the last 5 years already requiring surveillance chest CT Scans). Smoking Status Current Smoker  Visit Components: Discussion included one or more decision making aids. Yes Discussion included risk/benefits of screening. Yes Discussion included potential follow up diagnostic testing for abnormal scans. Yes Discussion included meaning and risk of over diagnosis. Yes Discussion included meaning and risk of False Positives. Yes Discussion included meaning of total radiation exposure. Yes  Counseling Included: Importance of adherence to annual lung cancer LDCT screening. Yes Impact of comorbidities on ability to participate in the program. Yes Ability and willingness to under diagnostic treatment: Yes  Smoking Cessation Counseling: Current Smokers:  Discussed importance of smoking cessation. Yes Information about tobacco cessation classes and interventions provided to patient. Yes Symptomatic Patient. No Diagnosis Code: Tobacco Use Z72.0 Asymptomatic Patient  Yes  Counseling (Intermediate counseling: > three minutes counseling) H0865 Information about tobacco cessation classes and interventions provided to patient. Yes Written Order for Lung Cancer Screening with LDCT placed in Epic. Yes (CT Chest Lung Cancer Screening Low Dose W/O CM) [id number] Z12.2-Screening of respiratory organs Z87.891-Personal history of nicotine dependence   I have spent 25 minutes of face to face/ virtual visit  time with the patient discussing the risks and benefits of lung cancer screening. We discussed the above noted topics. We paused at intervals to allow for questions to be asked and answered to ensure understanding.We discussed that the single most powerful action that anyone can take to decrease their risk of developing lung cancer is to quit smoking.  We discussed options for tools to aid in quitting smoking including nicotine replacement therapy, non-nicotine medications, support groups, Quit Smart classes, and behavior modification. We discussed that often times setting smaller, more achievable goals, such as eliminating 1 cigarette a day for a week and then 2 cigarettes a day for a week can be helpful in slowly decreasing the number of cigarettes smoked. I provided  them  with smoking cessation  information  with contact information for community resources, classes, free nicotine replacement therapy, and access to mobile apps, text messaging, and on-line smoking cessation help. I have also provided  them  the office contact information in the event they have any questions. We discussed the time and location of the scan, and that either Abigail Miyamoto RN, Karlton Lemon, RN  or I will call / send a letter with the results within 24-72 hours of receiving them. The patient verbalized understanding of all of  the above and had no further questions upon leaving the office. They have my contact information in the event they have any further  questions.  I spent 2 minutes counseling  on smoking cessation and the health risks of continued tobacco abuse.  I explained to the patient that there has been a high incidence of coronary artery disease noted on these exams. I explained that this is a non-gated exam therefore degree or severity cannot be determined. This patient is on statin therapy. I have asked the patient to follow-up with their PCP regarding any incidental finding of coronary artery disease and management with diet or medication as their PCP  feels is clinically indicated. The patient verbalized understanding of the above and had no further questions upon completion of the visit.    Darcella Gasman Loretta Doutt, PA-C

## 2022-11-19 NOTE — Patient Instructions (Signed)

## 2022-11-23 ENCOUNTER — Ambulatory Visit
Admission: RE | Admit: 2022-11-23 | Discharge: 2022-11-23 | Disposition: A | Discharge status: Home / Self Care | Payer: Medicare HMO | Source: Ambulatory Visit | Attending: *Deleted | Admitting: *Deleted

## 2022-11-23 DIAGNOSIS — F17210 Nicotine dependence, cigarettes, uncomplicated: Secondary | ICD-10-CM | POA: Insufficient documentation

## 2022-11-23 DIAGNOSIS — Z87891 Personal history of nicotine dependence: Secondary | ICD-10-CM

## 2022-11-23 DIAGNOSIS — Z122 Encounter for screening for malignant neoplasm of respiratory organs: Secondary | ICD-10-CM | POA: Insufficient documentation

## 2022-12-17 ENCOUNTER — Other Ambulatory Visit: Payer: Self-pay

## 2022-12-17 DIAGNOSIS — Z87891 Personal history of nicotine dependence: Secondary | ICD-10-CM

## 2022-12-17 DIAGNOSIS — F17210 Nicotine dependence, cigarettes, uncomplicated: Secondary | ICD-10-CM

## 2022-12-17 DIAGNOSIS — Z122 Encounter for screening for malignant neoplasm of respiratory organs: Secondary | ICD-10-CM

## 2023-05-05 ENCOUNTER — Other Ambulatory Visit: Payer: Self-pay | Admitting: Internal Medicine

## 2023-05-05 ENCOUNTER — Encounter: Payer: Self-pay | Admitting: Internal Medicine

## 2023-05-05 DIAGNOSIS — Z87891 Personal history of nicotine dependence: Secondary | ICD-10-CM

## 2023-05-05 DIAGNOSIS — R0602 Shortness of breath: Secondary | ICD-10-CM

## 2023-05-25 ENCOUNTER — Other Ambulatory Visit: Payer: Self-pay | Admitting: Family Medicine

## 2023-05-25 DIAGNOSIS — Z780 Asymptomatic menopausal state: Secondary | ICD-10-CM

## 2023-05-25 DIAGNOSIS — I10 Essential (primary) hypertension: Secondary | ICD-10-CM

## 2023-05-26 ENCOUNTER — Encounter (INDEPENDENT_AMBULATORY_CARE_PROVIDER_SITE_OTHER): Payer: Self-pay | Admitting: Nurse Practitioner

## 2023-05-26 ENCOUNTER — Ambulatory Visit (INDEPENDENT_AMBULATORY_CARE_PROVIDER_SITE_OTHER): Payer: Medicare HMO | Admitting: Nurse Practitioner

## 2023-05-26 ENCOUNTER — Ambulatory Visit (INDEPENDENT_AMBULATORY_CARE_PROVIDER_SITE_OTHER): Payer: Medicare HMO

## 2023-05-26 VITALS — BP 146/79 | HR 73 | Resp 16 | Wt 184.6 lb

## 2023-05-26 DIAGNOSIS — I10 Essential (primary) hypertension: Secondary | ICD-10-CM

## 2023-05-26 DIAGNOSIS — E785 Hyperlipidemia, unspecified: Secondary | ICD-10-CM

## 2023-05-26 DIAGNOSIS — I70213 Atherosclerosis of native arteries of extremities with intermittent claudication, bilateral legs: Secondary | ICD-10-CM

## 2023-05-27 ENCOUNTER — Encounter (INDEPENDENT_AMBULATORY_CARE_PROVIDER_SITE_OTHER): Payer: Medicare HMO

## 2023-05-27 ENCOUNTER — Ambulatory Visit (INDEPENDENT_AMBULATORY_CARE_PROVIDER_SITE_OTHER): Payer: Medicare HMO | Admitting: Vascular Surgery

## 2023-05-30 ENCOUNTER — Encounter (INDEPENDENT_AMBULATORY_CARE_PROVIDER_SITE_OTHER): Payer: Self-pay | Admitting: Nurse Practitioner

## 2023-05-30 LAB — VAS US ABI WITH/WO TBI
Left ABI: 1.03
Right ABI: 0.7

## 2023-05-30 NOTE — Progress Notes (Signed)
 Subjective:    Patient ID: Rachel Best, female    DOB: [birth year], 78 y.o.   MRN: [id number] Chief Complaint  Patient presents with   Follow-up    [name] follow up    Patient returns today in follow up of her PAD.  She continues to have some degree of claudication although this is not lifestyle limiting.  She denies any ulceration or infection.  She has undergone left lower extremity revascularization several years ago.  Her ABIs today are 0.70 on the right and 1.03 on the left with multiphasic waveforms on the right with triphasic waveforms on the left and normal toe waveforms    Review of Systems  All other systems reviewed and are negative.      Objective:   Physical Exam Vitals reviewed.  HENT:     Head: Normocephalic.  Cardiovascular:     Rate and Rhythm: Normal rate.     Pulses:          Dorsalis pedis pulses are detected w/ Doppler on the right side and detected w/ Doppler on the left side.       Posterior tibial pulses are detected w/ Doppler on the right side and detected w/ Doppler on the left side.  Pulmonary:     Effort: Pulmonary effort is normal.  Skin:    General: Skin is warm and dry.  Neurological:     Mental Status: She is alert and oriented to person, place, and time.  Psychiatric:        Mood and Affect: Mood normal.        Behavior: Behavior normal.        Thought Content: Thought content normal.        Judgment: Judgment normal.     BP (!) 146/79   Pulse 73   Resp 16   Wt 184 lb 9.6 oz (83.7 kg)   BMI 28.07 kg/m   Past Medical History:  Diagnosis Date   Hyperlipidemia    Hypertension    Left leg DVT (HCC)    Restless legs syndrome (RLS)     Social History   Socioeconomic History   Marital status: Widowed    Spouse name: Not on file   Number of children: Not on file   Years of education: Not on file   Highest education level: Not on file  Occupational History   Not on file  Tobacco Use   Smoking status: Former    Current  packs/day: 0.50    Average packs/day: 0.5 packs/day for 63.5 years (31.8 ttl pk-yrs)    Types: Cigarettes    Start date: 11/19/1959   Smokeless tobacco: Never  Vaping Use   Vaping status: Never Used  Substance and Sexual Activity   Alcohol use: No   Drug use: No   Sexual activity: Not on file  Other Topics Concern   Not on file  Social History Narrative   Not on file   Social Drivers of Health   Financial Resource Strain: Medium Risk (05/17/2023)   Received from [facility]   Overall Financial Resource Strain (CARDIA)    Difficulty of Paying Living Expenses: Somewhat hard  Food Insecurity: Food Insecurity Present (05/17/2023)   Received from [facility]   Hunger Vital Sign    Worried About Running Out of Food in the Last Year: Sometimes true    Ran Out of Food in the Last Year: Never true  Transportation Needs: No Transportation Needs (  05/17/2023)   Received from [facility]   PRAPARE - Transportation    In the past 12 months, has lack of transportation kept you from medical appointments or from getting medications?: No    Lack of Transportation (Non-Medical): No  Physical Activity: Not on file  Stress: Not on file  Social Connections: Not on file  Intimate Partner Violence: Not on file    Past Surgical History:  Procedure Laterality Date   ABDOMINAL HYSTERECTOMY     CHOLECYSTECTOMY     COLONOSCOPY     COLONOSCOPY WITH PROPOFOL  N/A 12/19/2014   Procedure: COLONOSCOPY WITH PROPOFOL ;  Surgeon: Cassie Click, MD;  Location: [facility] ENDOSCOPY;  Service: Endoscopy;  Laterality: N/A;   COLONOSCOPY WITH PROPOFOL  N/A 09/25/2021   Procedure: COLONOSCOPY WITH PROPOFOL ;  Surgeon: Shane Darling, MD;  Location: [facility] ENDOSCOPY;  Service: Endoscopy;  Laterality: N/A;   LOWER EXTREMITY ANGIOGRAPHY Left 11/29/2016   Procedure: Lower Extremity Angiography;  Surgeon: Celso College, MD;  Location: [facility] INVASIVE CV LAB;  Service:  Cardiovascular;  Laterality: Left;    Family History  Problem Relation Age of Onset   Diabetes Mother    Hypertension Mother    Breast cancer Mother 75   Varicose Veins Maternal Grandmother    Breast cancer Maternal Aunt    Breast cancer Cousin        mat cousin    Allergies  Allergen Reactions   Penicillins     Has patient had a PCN reaction causing immediate rash, facial/tongue/throat swelling, SOB or lightheadedness with hypotension: Yes Has patient had a PCN reaction causing severe rash involving mucus membranes or skin necrosis: No Has patient had a PCN reaction that required hospitalization: No Has patient had a PCN reaction occurring within the last 10 years: No If all of the above answers are "NO", then may proceed with Cephalosporin use.    Aspirin  Nausea And Vomiting       Latest Ref Rng & Units 01/26/2021    1:48 AM 04/02/2011    9:09 AM  CBC  WBC 4.0 - 10.5 K/uL 10.7  6.1   Hemoglobin 12.0 - 15.0 g/dL 82.9  56.2   Hematocrit 36.0 - 46.0 % 41.6  42.0   Platelets 150 - 400 K/uL 340  270       CMP     Component Value Date/Time   NA 139 01/26/2021 0148   NA 142 04/02/2011 0909   K 3.8 01/26/2021 0148   K 3.7 04/02/2011 0909   CL 107 01/26/2021 0148   CL 103 04/02/2011 0909   CO2 27 01/26/2021 0148   CO2 32 04/02/2011 0909   GLUCOSE 127 (H) 01/26/2021 0148   GLUCOSE 103 (H) 04/02/2011 0909   BUN 14 01/26/2021 0148   BUN 7 04/02/2011 0909   CREATININE 0.78 01/26/2021 0148   CREATININE 0.83 04/02/2011 0909   CALCIUM 9.6 01/26/2021 0148   CALCIUM 9.3 04/02/2011 0909   PROT 7.9 04/02/2011 0909   ALBUMIN 4.0 04/02/2011 0909   AST 8 (L) 04/02/2011 0909   ALT 21 04/02/2011 0909   ALKPHOS 101 04/02/2011 0909   BILITOT 0.4 04/02/2011 0909   GFRNONAA >60 01/26/2021 0148   GFRNONAA >60 04/02/2011 0909     No results found.     Assessment & Plan:   1. Atherosclerosis of native artery of both lower extremities with intermittent claudication (HCC)  (Primary)  Recommend:  The patient has evidence of atherosclerosis of the lower extremities with  claudication.  The patient does not voice lifestyle limiting changes at this point in time.  Noninvasive studies do not suggest clinically significant change.  No invasive studies, angiography or surgery at this time The patient should continue walking and begin a more formal exercise program.  The patient should continue antiplatelet therapy and aggressive treatment of the lipid abnormalities  No changes in the patient's medications at this time  Continued surveillance is indicated as atherosclerosis is likely to progress with time.    The patient will continue follow up with noninvasive studies as ordered.   2. Essential hypertension Continue antihypertensive medications as already ordered, these medications have been reviewed and there are no changes at this time.  3. Hyperlipidemia, unspecified hyperlipidemia type Continue statin as ordered and reviewed, no changes at this time   Current Outpatient Medications on File Prior to Visit  Medication Sig Dispense Refill   acetaminophen (TYLENOL) 650 MG CR tablet Take 1,300 mg by mouth every 8 (eight) hours as needed for pain.     clopidogrel  (PLAVIX ) 75 MG tablet Take 1 tablet (75 mg total) by mouth daily. 90 tablet 3   losartan (COZAAR) 25 MG tablet Take 1 tablet by mouth daily.     simvastatin (ZOCOR) 40 MG tablet Take 40 mg by mouth at bedtime.     albuterol  (VENTOLIN  HFA) 108 (90 Base) MCG/ACT inhaler Inhale 1-2 puffs into the lungs every 6 (six) hours as needed for wheezing or shortness of breath. (Patient not taking: Reported on 05/26/2023) 1 g 0   Ascorbic Acid (VITAMIN C) 1000 MG tablet Take 1,000 mg by mouth daily. (Patient not taking: Reported on 05/26/2023)     Calcium Carb-Cholecalciferol (CALCIUM 600+D3 PO) Take 1 tablet by mouth once a week. (Patient not taking: Reported on 05/26/2023)     cetirizine  (ZYRTEC ) 10 MG tablet Take 1  tablet (10 mg total) by mouth daily as needed for allergies. (Patient not taking: Reported on 05/26/2023) 30 tablet 11   gabapentin  (NEURONTIN ) 300 MG capsule Take 1 capsule (300 mg total) by mouth 3 (three) times daily as needed. 90 capsule 0   ipratropium (ATROVENT ) 0.06 % nasal spray Place 2 sprays into both nostrils 4 (four) times daily. (Patient not taking: Reported on 05/26/2023) 15 mL 12   Omega-3 Fatty Acids (FISH OIL) 1000 MG CAPS Take 2,000 mg by mouth 2 (two) times a week. (Patient not taking: Reported on 10/02/2019)     No current facility-administered medications on file prior to visit.    There are no Patient Instructions on file for this visit. No follow-ups on file.   Narya Beavin E Jameka Ivie, NP

## 2023-06-06 ENCOUNTER — Other Ambulatory Visit

## 2023-06-07 ENCOUNTER — Inpatient Hospital Stay: Admission: RE | Admit: 2023-06-07 | Source: Ambulatory Visit

## 2023-06-28 ENCOUNTER — Encounter (INDEPENDENT_AMBULATORY_CARE_PROVIDER_SITE_OTHER): Payer: Self-pay

## 2023-07-18 ENCOUNTER — Ambulatory Visit
Admission: EM | Admit: 2023-07-18 | Discharge: 2023-07-18 | Disposition: A | Discharge status: Home / Self Care | Attending: Family Medicine | Admitting: Family Medicine

## 2023-07-18 DIAGNOSIS — R109 Unspecified abdominal pain: Secondary | ICD-10-CM | POA: Diagnosis not present

## 2023-07-18 DIAGNOSIS — R1084 Generalized abdominal pain: Secondary | ICD-10-CM

## 2023-07-18 LAB — URINALYSIS, W/ REFLEX TO CULTURE (INFECTION SUSPECTED)
Bilirubin Urine: NEGATIVE
Glucose, UA: 100 mg/dL — AB
Leukocytes,Ua: NEGATIVE
Nitrite: NEGATIVE
Protein, ur: NEGATIVE mg/dL
Specific Gravity, Urine: 1.025 (ref 1.005–1.030)
WBC, UA: NONE SEEN WBC/hpf (ref 0–5)
pH: 5.5 (ref 5.0–8.0)

## 2023-07-18 NOTE — ED Triage Notes (Signed)
 Patient presents to UC for left side rib pain x 2 days ago. States "feels like something is leaking." Describes pain as a burning sensation. Pain only when in supine position.   Denies injury.

## 2023-07-18 NOTE — Discharge Instructions (Addendum)
 Your urine sample did not show evidence of an urinary tract infection nor did you have significant amounts of blood in your urine to suggest a kidney stone.  You do have some left-sided abdominal tenderness when I press on you.  We discussed getting a CT of your belly but this is done in the emergency department.

## 2023-07-18 NOTE — ED Provider Notes (Signed)
 [facility]    CSN: [id number] Arrival date & time: 07/18/23  9162      History   Chief Complaint Chief Complaint  Patient presents with   Chest Pain    Rib pain     HPI  HPI Rachel Best is a 78 y.o. female.   Rachel Best presents for burning sensation on her left side that started 3 days ago.  Feels like water  running down.  Like I might be peeing on herself.  No urinary incontinence.  She is not able to sleep due to discomfort.  No fever, hematuria or history of kidney stones. Denies recent heavy lifting.     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   Hyperlipidemia 03/12/2016   Tobacco use disorder 03/12/2016   Atherosclerosis of native arteries of extremity with intermittent claudication (HCC) 03/12/2016   Cataract of both eyes 10/16/2015   Essential hypertension 10/16/2015   Glaucoma suspect of both eyes 10/16/2015   Hypercholesterolemia 10/16/2015   History of colonic polyps 12/30/2014   Glaucoma 12/06/2013    Past Surgical History:  Procedure Laterality Date   ABDOMINAL HYSTERECTOMY     CHOLECYSTECTOMY     COLONOSCOPY     COLONOSCOPY WITH PROPOFOL  N/A 12/19/2014   Procedure: COLONOSCOPY WITH PROPOFOL ;  Surgeon: Lamar ONEIDA Holmes, MD;  Location[facility] ENDOSCOPY;  Service: Endoscopy;  Laterality: N/A;   COLONOSCOPY WITH PROPOFOL  N/A 09/25/2021   Procedure: COLONOSCOPY WITH PROPOFOL ;  Surgeon: Maryruth Ole ONEIDA, MD;  Location: [facility] ENDOSCOPY;  Service: Endoscopy;  Laterality: N/A;   LOWER EXTREMITY ANGIOGRAPHY Left 11/29/2016   Procedure: Lower Extremity Angiography;  Surgeon: Marea Selinda RAMAN, MD;   Location: [facility] INVASIVE CV LAB;  Service: Cardiovascular;  Laterality: Left;    OB History   No obstetric history on file.      Home Medications    Prior to Admission medications   Medication Sig Start Date End Date Taking? Authorizing Provider  acetaminophen (TYLENOL) 650 MG CR tablet Take 1,300 mg by mouth every 8 (eight) hours as needed for pain.    [provider]  albuterol  (VENTOLIN  HFA) 108 (90 Base) MCG/ACT inhaler Inhale 1-2 puffs into the lungs every 6 (six) hours as needed for wheezing or shortness of breath. Patient not taking: Reported on 05/26/2023 11/26/21   Arvis Jolan NOVAK, PA-C  Ascorbic Acid (VITAMIN C) 1000 MG tablet Take 1,000 mg by mouth daily. Patient not taking: Reported on 05/26/2023    [provider]  Calcium Carb-Cholecalciferol (CALCIUM 600+D3 PO) Take 1 tablet by mouth once a week. Patient not taking: Reported on 05/26/2023    [provider]  cetirizine  (ZYRTEC ) 10 MG tablet Take 1 tablet (10 mg total) by mouth daily as needed for allergies. Patient not taking: Reported on 05/26/2023 07/14/22   Stonewall Doss, DO  clopidogrel  (PLAVIX ) 75 MG tablet Take 1 tablet (75 mg total) by mouth daily. 06/25/19   Brown, Fallon E, NP  gabapentin  (NEURONTIN ) 300 MG capsule Take 1 capsule (300 mg total) by mouth 3 (three) times daily as needed. 02/17/22 06/04/22  Annaliz Aven, DO  ipratropium (ATROVENT ) 0.06 % nasal  spray Place 2 sprays into both nostrils 4 (four) times daily. Patient not taking: Reported on 05/26/2023 07/14/22   Karsynn Deweese, DO  losartan (COZAAR) 25 MG tablet Take 1 tablet by mouth daily. 06/18/20 05/26/23  [provider]  Omega-3 Fatty Acids (FISH OIL) 1000 MG CAPS Take 2,000 mg by mouth 2 (two) times a week. Patient not taking: Reported on 10/02/2019    [provider]  simvastatin (ZOCOR) 40 MG tablet Take 40 mg by mouth at bedtime. 11/08/16   [provider]    Family History Family History  Problem  Relation Age of Onset   Diabetes Mother    Hypertension Mother    Breast cancer Mother 37   Varicose Veins Maternal Grandmother    Breast cancer Maternal Aunt    Breast cancer Cousin        mat cousin    Social History Social History   Tobacco Use   Smoking status: Former    Current packs/day: 0.50    Average packs/day: 0.5 packs/day for 63.7 years (31.8 ttl pk-yrs)    Types: Cigarettes    Start date: 11/19/1959   Smokeless tobacco: Never  Vaping Use   Vaping status: Never Used  Substance Use Topics   Alcohol use: No   Drug use: No     Allergies   Penicillins and Aspirin    Review of Systems Review of Systems: :negative unless otherwise stated in HPI.      Physical Exam Triage Vital Signs ED Triage Vitals  Encounter Vitals Group     BP 07/18/23 0921 (!) 173/92     Systolic BP Percentile --      Diastolic BP Percentile --      Pulse Rate 07/18/23 0921 94     Resp 07/18/23 0921 16     Temp 07/18/23 0921 98.6 F (37 C)     Temp Source 07/18/23 0921 Oral     SpO2 07/18/23 0921 98 %     Weight --      Height --      Head Circumference --      Peak Flow --      Pain Score 07/18/23 0920 0     Pain Loc --      Pain Education --      Exclude from Growth Chart --    No data found.  Updated Vital Signs BP (!) 140/70 (BP Location: Left Arm)   Pulse 94   Temp 98.6 F (37 C) (Oral)   Resp 16   SpO2 98%   Visual Acuity Right Eye Distance:   Left Eye Distance:   Bilateral Distance:    Right Eye Near:   Left Eye Near:    Bilateral Near:     Physical Exam GEN: well appearing female in no acute distress  CVS: well perfused  RESP: speaking in full sentences without pause, no respiratory distress  MSK: left flank without deformity or rib tenderness, no overlying  skin lesions or bruising ABD:   Soft, left upper and lower tenderness, nondistended, no guarding, no rebound, active bowel sounds throughout, negative McBurney's, negative Murphy's Sensation  intact. Peripheral pulses intact.   UC Treatments / Results  Labs (all labs ordered are listed, but only abnormal results are displayed) Labs Reviewed  URINALYSIS, W/ REFLEX TO CULTURE (INFECTION SUSPECTED) - Abnormal; Notable for the following components:      Result Value   Glucose, UA 100 (*)    Hgb urine dipstick TRACE (*)  Ketones, ur TRACE (*)    Bacteria, UA RARE (*)    All other components within normal limits    EKG   Radiology No results found.   Procedures Procedures (including critical care time)  Medications Ordered in UC Medications - No data to display  Initial Impression / Assessment and Plan / UC Course  I have reviewed the triage vital signs and the nursing notes.  Pertinent labs & imaging results that were available during my care of the patient were reviewed by me and considered in my medical decision making (see chart for details).      Pt is a 78 y.o.  female with left flank pain with burning sensation. On exam, pt without flank or rib tenderness though she has left sided abdominal tenderness. Imaging deferred. Obtained urinalysis which was remarkable for glucosuria, hematuria which was not supported on microscopy and trace ketonuria. Doubt UTI or kidney stones.  There are no skin changes to suggest Shingles or cellulitis.   Discussed advanced imaging but she doesn't want to go the ED and we don't have CT available here today.   Patient to gradually return to normal activities, as tolerated and continue ordinary activities within the limits permitted by pain. Counseled patient on red flag symptoms and when to seek immediate care.  Return and ED precautions given. Understanding voiced. Discussed MDM, treatment plan and plan for follow-up with patient who agrees with plan.   Final Clinical Impressions(s) / UC Diagnoses   Final diagnoses:  Left flank pain  Generalized abdominal pain     Discharge Instructions      Your urine sample did not  show evidence of an urinary tract infection nor did you have significant amounts of blood in your urine to suggest a kidney stone.  You do have some left-sided abdominal tenderness when I press on you.  We discussed getting a CT of your belly but this is done in the emergency department.     ED Prescriptions   None    PDMP not reviewed this encounter.   Greta Yung, DO 07/30/23 2350

## 2023-09-13 NOTE — Progress Notes (Signed)
 This encounter was created in error - please disregard.

## 2023-10-07 ENCOUNTER — Other Ambulatory Visit: Payer: Self-pay | Admitting: Family Medicine

## 2023-10-07 DIAGNOSIS — Z1231 Encounter for screening mammogram for malignant neoplasm of breast: Secondary | ICD-10-CM

## 2023-11-09 ENCOUNTER — Ambulatory Visit
Admission: RE | Admit: 2023-11-09 | Discharge: 2023-11-09 | Disposition: A | Discharge status: Home / Self Care | Source: Ambulatory Visit | Attending: Family Medicine | Admitting: Family Medicine

## 2023-11-09 DIAGNOSIS — Z780 Asymptomatic menopausal state: Secondary | ICD-10-CM | POA: Insufficient documentation

## 2023-11-09 DIAGNOSIS — Z1231 Encounter for screening mammogram for malignant neoplasm of breast: Secondary | ICD-10-CM | POA: Diagnosis present

## 2023-11-09 DIAGNOSIS — I10 Essential (primary) hypertension: Secondary | ICD-10-CM

## 2023-11-24 ENCOUNTER — Ambulatory Visit: Attending: *Deleted

## 2024-05-25 ENCOUNTER — Ambulatory Visit (INDEPENDENT_AMBULATORY_CARE_PROVIDER_SITE_OTHER): Admitting: Nurse Practitioner

## 2024-05-25 ENCOUNTER — Encounter (INDEPENDENT_AMBULATORY_CARE_PROVIDER_SITE_OTHER)
